# Patient Record
Sex: Female | Born: 1990 | Race: White | Hispanic: No | Marital: Single | Smoking: Former smoker
Health system: Southern US, Community
[De-identification: ages and names within clinical notes are randomized; demographics above are authoritative.]

## PROBLEM LIST (undated history)

## (undated) DIAGNOSIS — F319 Bipolar disorder, unspecified: Secondary | ICD-10-CM

## (undated) DIAGNOSIS — O99210 Obesity complicating pregnancy, unspecified trimester: Secondary | ICD-10-CM

## (undated) DIAGNOSIS — O99340 Other mental disorders complicating pregnancy, unspecified trimester: Secondary | ICD-10-CM

## (undated) DIAGNOSIS — K429 Umbilical hernia without obstruction or gangrene: Secondary | ICD-10-CM

## (undated) DIAGNOSIS — E079 Disorder of thyroid, unspecified: Secondary | ICD-10-CM

## (undated) DIAGNOSIS — E669 Obesity, unspecified: Secondary | ICD-10-CM

## (undated) DIAGNOSIS — F419 Anxiety disorder, unspecified: Secondary | ICD-10-CM

## (undated) HISTORY — PX: HERNIA REPAIR: SHX51

## (undated) HISTORY — PX: BACK SURGERY: SHX140

## (undated) HISTORY — DX: Anxiety disorder, unspecified: F41.9

## (undated) HISTORY — DX: Other mental disorders complicating pregnancy, unspecified trimester: O99.340

## (undated) HISTORY — PX: ABDOMINAL SURGERY: SHX537

## (undated) HISTORY — DX: Umbilical hernia without obstruction or gangrene: K42.9

## (undated) HISTORY — DX: Obesity complicating pregnancy, unspecified trimester: O99.210

## (undated) HISTORY — DX: Disorder of thyroid, unspecified: E07.9

## (undated) HISTORY — DX: Obesity complicating pregnancy, unspecified trimester: E66.9

---
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
## 2011-03-04 ENCOUNTER — Emergency Department: Payer: Self-pay | Admitting: *Deleted

## 2011-03-04 LAB — URINALYSIS, COMPLETE
Nitrite: NEGATIVE
Protein: NEGATIVE
Specific Gravity: 1.023 (ref 1.003–1.030)
WBC UR: 9 /HPF (ref 0–5)

## 2011-03-04 LAB — PREGNANCY, URINE: Pregnancy Test, Urine: NEGATIVE m[IU]/mL

## 2011-03-30 ENCOUNTER — Ambulatory Visit: Payer: Self-pay | Admitting: Internal Medicine

## 2011-04-20 ENCOUNTER — Encounter: Payer: Self-pay | Admitting: Internal Medicine

## 2011-04-23 ENCOUNTER — Encounter: Payer: Self-pay | Admitting: Internal Medicine

## 2011-05-21 ENCOUNTER — Emergency Department: Payer: Self-pay | Admitting: Emergency Medicine

## 2011-07-27 ENCOUNTER — Ambulatory Visit (INDEPENDENT_AMBULATORY_CARE_PROVIDER_SITE_OTHER): Payer: Medicaid Other | Admitting: Family Medicine

## 2011-07-27 ENCOUNTER — Encounter: Payer: Self-pay | Admitting: Family Medicine

## 2011-07-27 VITALS — BP 105/73 | HR 81 | Ht 68.0 in | Wt 340.0 lb

## 2011-07-27 DIAGNOSIS — E669 Obesity, unspecified: Secondary | ICD-10-CM | POA: Insufficient documentation

## 2011-07-27 DIAGNOSIS — N946 Dysmenorrhea, unspecified: Secondary | ICD-10-CM

## 2011-07-27 DIAGNOSIS — Z3040 Encounter for surveillance of contraceptives, unspecified: Secondary | ICD-10-CM

## 2011-07-27 MED ORDER — DICLOFENAC SODIUM 75 MG PO TBEC: 75.0000 mg | DELAYED_RELEASE_TABLET | Freq: Two times a day (BID) | ORAL | Status: AC

## 2011-07-27 NOTE — Patient Instructions (Signed)
Contraceptive Implant Information A contraceptive implant is a plastic rod that is inserted under the skin. It is usually inserted under the skin of your upper arm. It continually releases small amounts of progestin (synthetic progesterone) into the bloodstream. This prevents an egg from being released from the ovary. It also thickens the cervical mucus to prevent sperm from entering the cervix, and it thins the uterine lining to prevent a fertilized egg from attaching to the uterus. They can be effective for up to 3 years. Implants do not provide protection against sexually transmitted diseases (STDs).  The procedure to insert an implant usually takes about 10 minutes. There may be minor bruising, swelling, and discomfort at the insertion site for a couple days. The implant begins to work within the first day. Other contraceptive protection should be used for 2 weeks. Follow up with your caregiver to get rechecked as directed. Your caregiver will make sure you are a good candidate for the contraceptive implant. Discuss with your caregiver the possible side effects of the implant ADVANTAGES  It prevents pregnancy for up to 3 years.   It is easily reversible.   It is convenient.   The progestins may protect against uterine and ovarian cancer.   It can be used when breastfeeding.   It can be used by women who cannot take estrogen.  DISADVANTAGES  You may have irregular or unplanned vaginal bleeding.   You may develop side effects, including headache, weight gain, acne, breast tenderness, or mood changes.   You may have tissue or nerve damage after insertion (rare).   It may be difficult and uncomfortable to remove.   Certain medications may interfere with the effectiveness of the implants.  REMOVAL OF IMPLANT The implant should be removed in 3 years or as directed by your caregiver. The implants effect wears off in a few hours after removal. Your ability to get pregnant (fertility) is  restored within a couple of weeks. New implants can be inserted as soon as the old ones are removed if desired. DO NOT GET THE IMPLANT IF:   You are pregnant.   You have a history of breast cancer, osteoporosis, blood clots, heart disease, diabetes, high blood pressure, liver disease, tumors, or stroke.    You have undiagnosed vaginal bleeding.   You have overly sensitive to certain parts of the implant.  Document Released: 01/28/2011 Document Reviewed: 01/26/2011 [facility] Patient Information 2012 [place], [place].Dysmenorrhea Menstrual pain is caused by the muscles of the uterus tightening (contracting) during a menstrual period. The muscles of the uterus contract due to the chemicals in the uterine lining. Primary dysmenorrhea is menstrual cramps that last a couple of days when you start having menstrual periods or soon after. This often begins after a teenager starts having her period. As a woman gets older or has a baby, the cramps will usually lesson or disappear. Secondary dysmenorrhea begins later in life, lasts longer, and the pain may be stronger than primary dysmenorrhea. The pain may start before the period and last a few days after the period. This type of dysmenorrhea is usually caused by an underlying problem such as:  The tissue lining the uterus grows outside of the uterus in other areas of the body (endometriosis).   The endometrial tissue, which normally lines the uterus, is found in or grows into the muscular walls of the uterus (adenomyosis).   The pelvic blood vessels are engorged with blood just before the menstrual period (pelvic congestive syndrome).   Overgrowth  of cells in the lining of the uterus or cervix (polyps of the uterus or cervix).   Falling down of the uterus (prolapse) because of loose or stretched ligaments.   Depression.   Bladder problems, infection, or inflammation.   Problems with the intestine, a tumor, or irritable bowel syndrome.   Cancer  of the female organs or bladder.   A severely tipped uterus.   A very tight opening or closed cervix.   Noncancerous tumors of the uterus (fibroids).   Pelvic inflammatory disease (PID).   Pelvic scarring (adhesions) from a previous surgery.   Ovarian cyst.   An intrauterine device (IUD) used for birth control.  CAUSES  The cause of menstrual pain is often unknown. SYMPTOMS   Cramping or throbbing pain in your lower abdomen.   Sometimes, a woman may also experience headaches.   Lower back pain.   Feeling sick to your stomach (nausea) or vomiting.   Diarrhea.   Sweating or dizziness.  DIAGNOSIS  A diagnosis is based on your history, symptoms, physical examination, diagnostic tests, or procedures. Diagnostic tests or procedures may include:  Blood tests.   An ultrasound.   An examination of the lining of the uterus (dilation and curettage, D&C).   An examination inside your abdomen or pelvis with a scope (laparoscopy).   X-rays.   CT Scan.   MRI.   An examination inside the bladder with a scope (cystoscopy).   An examination inside the intestine or stomach with a scope (colonoscopy, gastroscopy).  TREATMENT  Treatment depends on the cause of the dysmenorrhea. Treatment may include:  Pain medicine prescribed by your caregiver.   Birth control pills.   Hormone replacement therapy.   Nonsteroidal anti-inflammatory drugs (NSAIDs). These may help stop the production of prostaglandins.   An IUD with progesterone hormone in it.   Acupuncture.   Surgery to remove adhesions, endometriosis, ovarian cyst, or fibroids.   Removal of the uterus (hysterectomy).   Progesterone shots to stop the menstrual period.   Cutting the nerves on the sacrum that go to the female organs (presacral neurectomy).   Electric currant to the sacral nerves (sacral nerve stimulation).   Antidepressant medicine.   Psychiatric therapy, counseling, or group therapy.   Exercise  and physical therapy.   Meditation and yoga therapy.  HOME CARE INSTRUCTIONS   Only take over-the-counter or prescription medicines for pain, discomfort, or fever as directed by your caregiver.   Place a heating pad or hot water bottle on your lower back or abdomen. Do not sleep with the heating pad.   Use aerobic exercises, walking, swimming, biking, and other exercises to help lessen the cramping.   Massage to the lower back or abdomen may help.   Stop smoking.   Avoid alcohol and caffeine.   Yoga, meditation, or acupuncture may help.  SEEK MEDICAL CARE IF:   The pain does not get better with medicine.   You have pain with sexual intercourse.  SEEK IMMEDIATE MEDICAL CARE IF:   Your pain increases and is not controlled with medicines.   You have a fever.   You develop nausea or vomiting with your period not controlled with medicine.   You have abnormal vaginal bleeding with your period.   You pass out.  MAKE SURE YOU:   Understand these instructions.   Will watch your condition.   Will get help right away if you are not doing well or get worse.  Document Released: 02/08/2005 Document Revised: 01/28/2011 Document  Reviewed: 05/27/2008 [facility] Patient Information 2012 [place], [place].

## 2011-07-27 NOTE — Progress Notes (Signed)
  Subjective:    Patient ID: Tammy Hickman, female    DOB: 12-18-1990, 21 y.o.   MRN: [id number]  HPI Implanon placed approximately 9 months ago. Has had monthly cycles the somewhat heavy with lots of cramping. Additionally, has a boil that has come up on her arm this thinks might be related to her Implanon.   Review of Systems  Constitutional: Negative for fever.  Gastrointestinal: Positive for abdominal pain (cramping with cycle).  Genitourinary: Positive for vaginal bleeding.       Objective:   Physical Exam  Vitals reviewed. Constitutional: She is oriented to person, place, and time. She appears well-developed and well-nourished.  HENT:  Head: Normocephalic and atraumatic.  Neck: Neck supple.  Cardiovascular: Normal rate.   Abdominal: Soft.  Neurological: She is alert and oriented to person, place, and time.  Skin:       Healing furuncle noted.  Implanon palpated not near furuncle.          Assessment & Plan:   1. Dysmenorrhea  diclofenac (VOLTAREN) 75 MG EC tablet  2. Obesity    3. Contraception, generic surveillance    Continue Implanon

## 2011-10-07 ENCOUNTER — Emergency Department: Payer: Self-pay | Admitting: Emergency Medicine

## 2011-10-20 ENCOUNTER — Emergency Department: Payer: Self-pay | Admitting: Emergency Medicine

## 2012-03-23 ENCOUNTER — Emergency Department: Payer: Self-pay | Admitting: Emergency Medicine

## 2012-07-02 IMAGING — US ABDOMEN ULTRASOUND
1 series · 17 of 25 positions shown · non-contrast
Comparison: none

REASON FOR EXAM: periumbilical pain on left prior right hernia
COMMENTS:

[Series 1: abdomen ultrasound · 17 of 66 slices shown]
[im 1/66]
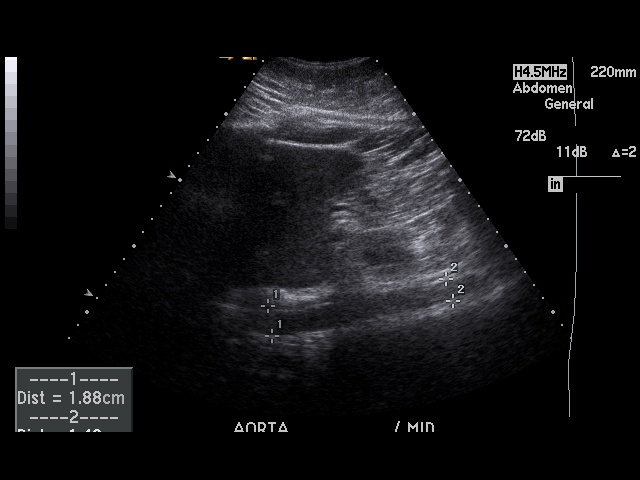
[im 6/66]
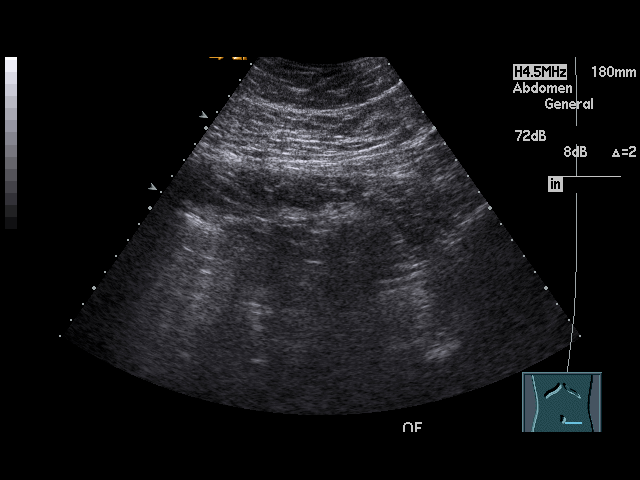
[im 9/66]
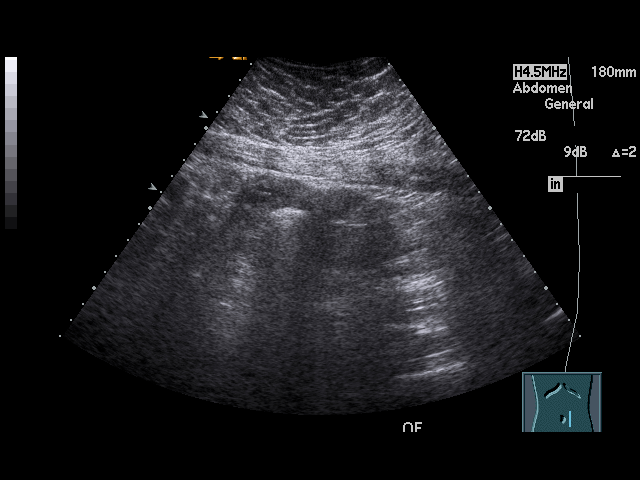
[im 14/66]
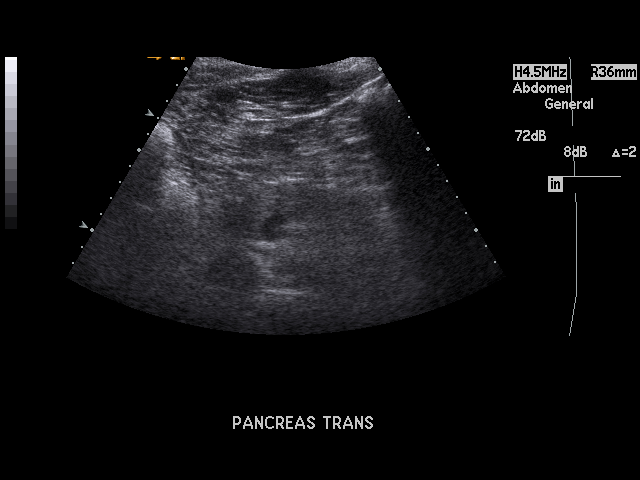
[im 17/66]
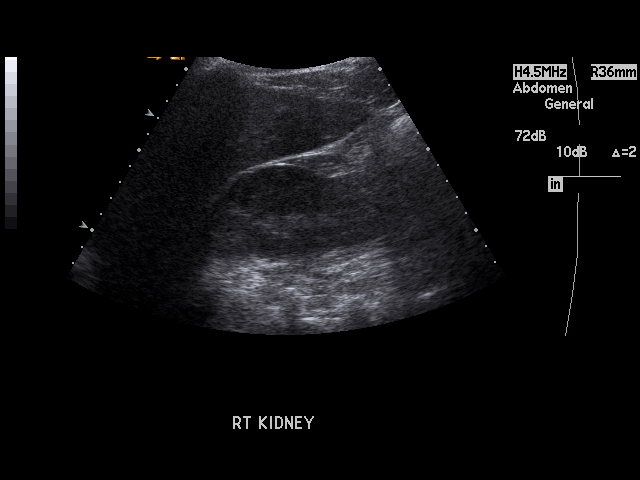
[im 22/66]
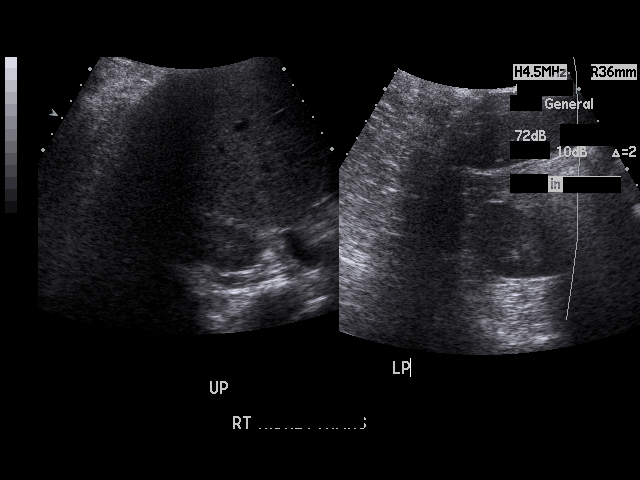
[im 25/66]
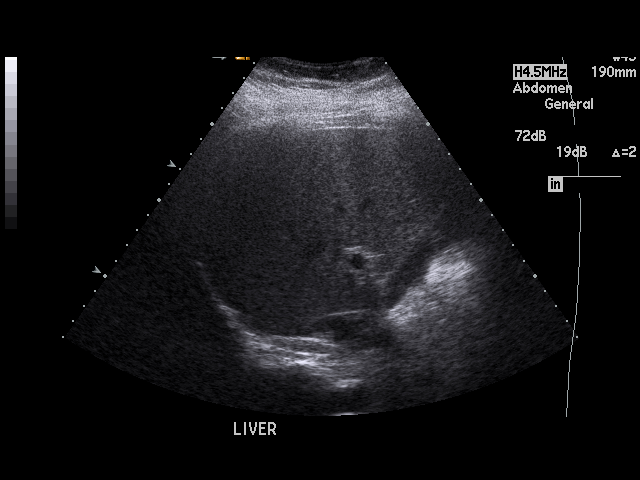
[im 30/66]
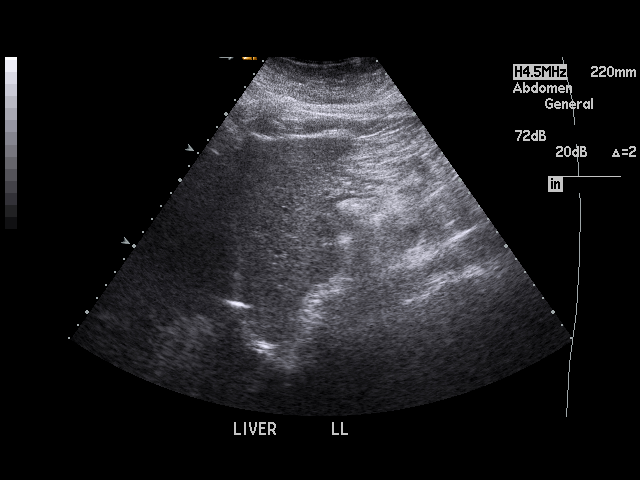
[im 33/66]
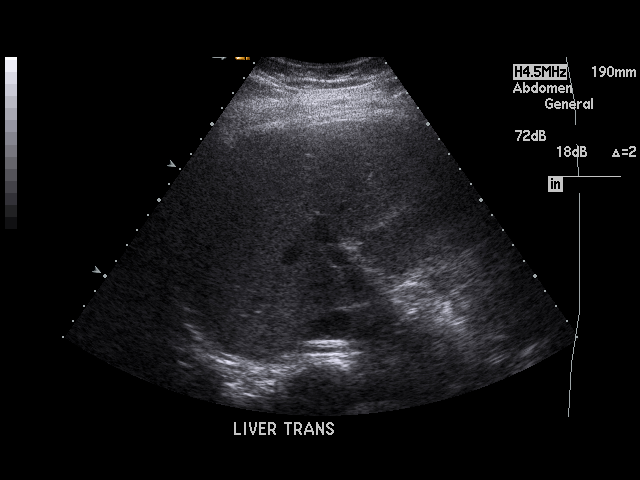
[im 36/66]
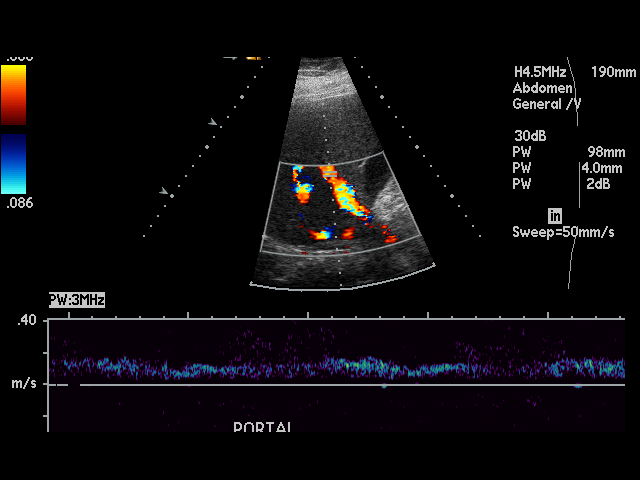
[im 41/66]
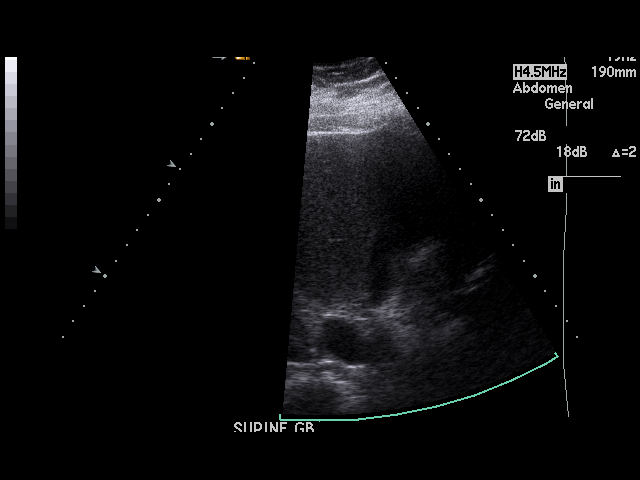
[im 44/66]
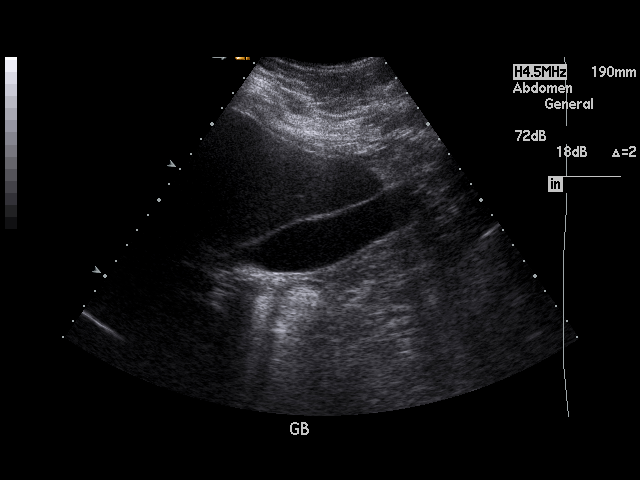
[im 49/66]
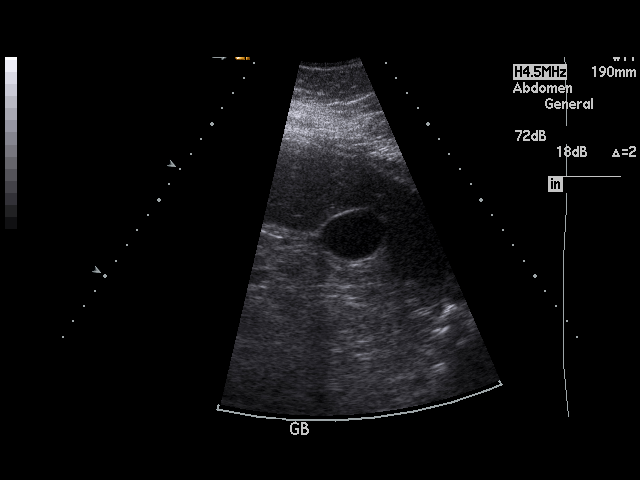
[im 52/66]
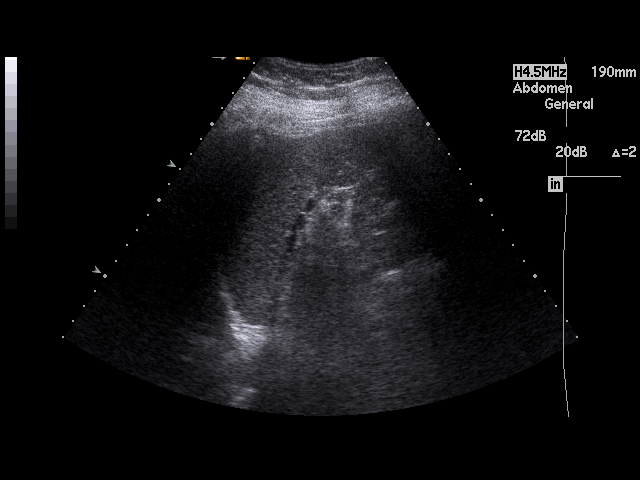
[im 57/66]
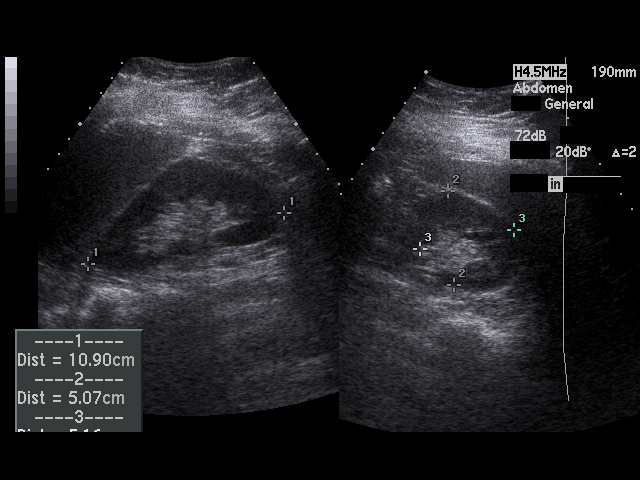
[im 60/66]
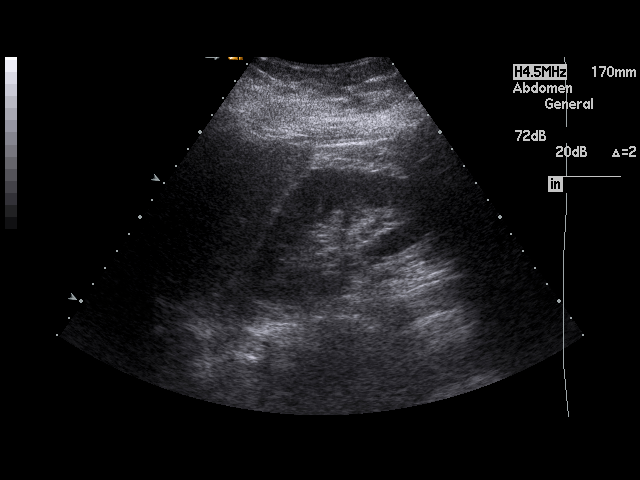
[im 66/66]
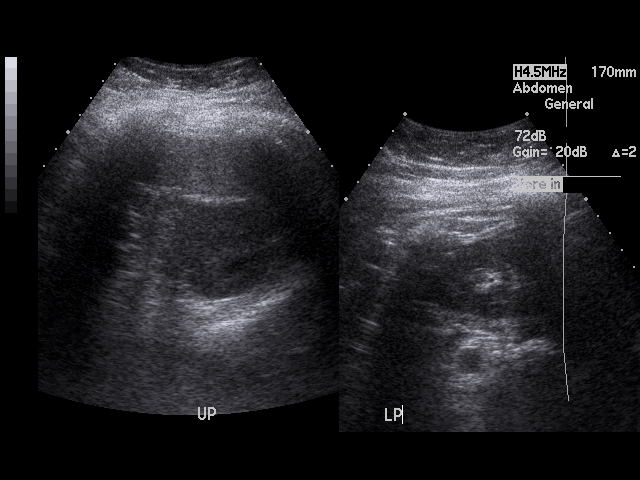

[17 of 25 positions shown; findings below may reference images not displayed]

PROCEDURE:     US  - US ABDOMEN GENERAL SURVEY  - December 29, 2010  [DATE]

RESULT:     Ultrasound of the abdomen demonstrates no evidence of an
umbilical hernia. CT may be more sensitive for a fat filled hernia.
Otherwise, the abdomen appears to be unremarkable. Portions of the pancreas
are obscured by overlying bowel gas. The aorta is normal in caliber. The
common bile duct diameter is 2.5 mm. The portal venous flow is normal. The
inferior vena cava, spleen, liver and kidneys appear normal. No gallstones
are present. The gallbladder wall thickness is 2.2 mm. There is no ascites
or pericholecystic fluid. No sonographic Murphy's sign is present. Right
kidney cortical thickness is 1.34 cm. Left renal cortical thickness is
cm. Right kidney length is 11.81 cm while left kidney length is 10.90 cm.
IMPRESSION: 1.     Limited visualization of portions of the pancreas. Otherwise,
unremarkable abdominal sonogram. CT evaluation for hernia would be more
sensitive than ultrasound especially for a fat filled hernia.

## 2012-08-11 ENCOUNTER — Emergency Department: Payer: Self-pay | Admitting: Emergency Medicine

## 2012-09-14 ENCOUNTER — Emergency Department: Payer: Self-pay | Admitting: Emergency Medicine

## 2012-09-14 LAB — CBC
HCT: 39.4 % (ref 35.0–47.0)
MCH: 26.5 pg (ref 26.0–34.0)
MCHC: 32.8 g/dL (ref 32.0–36.0)
MCV: 81 fL (ref 80–100)
RBC: 4.87 10*6/uL (ref 3.80–5.20)
WBC: 17.9 10*3/uL — ABNORMAL HIGH (ref 3.6–11.0)

## 2012-09-14 LAB — BASIC METABOLIC PANEL
BUN: 13 mg/dL (ref 7–18)
Calcium, Total: 9.1 mg/dL (ref 8.5–10.1)
Chloride: 102 mmol/L (ref 98–107)
Osmolality: 271 (ref 275–301)
Sodium: 136 mmol/L (ref 136–145)

## 2012-09-14 LAB — URINALYSIS, COMPLETE
Bilirubin,UR: NEGATIVE
Glucose,UR: NEGATIVE mg/dL (ref 0–75)
Ketone: NEGATIVE
Ph: 7 (ref 4.5–8.0)
RBC,UR: 5 /HPF (ref 0–5)

## 2012-10-01 ENCOUNTER — Emergency Department: Payer: Self-pay | Admitting: Emergency Medicine

## 2012-10-01 LAB — CBC
HCT: 40.5 % (ref 35.0–47.0)
HGB: 13.5 g/dL (ref 12.0–16.0)
MCH: 27.1 pg (ref 26.0–34.0)
MCHC: 33.4 g/dL (ref 32.0–36.0)
MCV: 81 fL (ref 80–100)
RBC: 4.99 10*6/uL (ref 3.80–5.20)

## 2012-10-01 LAB — URINALYSIS, COMPLETE
Glucose,UR: NEGATIVE mg/dL (ref 0–75)
Ketone: NEGATIVE
Leukocyte Esterase: NEGATIVE
Ph: 6 (ref 4.5–8.0)
Protein: NEGATIVE
RBC,UR: 2 /HPF (ref 0–5)
Specific Gravity: 1.018 (ref 1.003–1.030)
Squamous Epithelial: 3
WBC UR: 1 /HPF (ref 0–5)

## 2012-10-01 LAB — COMPREHENSIVE METABOLIC PANEL
Albumin: 3.5 g/dL (ref 3.4–5.0)
Alkaline Phosphatase: 83 U/L (ref 50–136)
Bilirubin,Total: 0.3 mg/dL (ref 0.2–1.0)
Calcium, Total: 8.8 mg/dL (ref 8.5–10.1)
Chloride: 107 mmol/L (ref 98–107)
Co2: 27 mmol/L (ref 21–32)
Creatinine: 0.7 mg/dL (ref 0.60–1.30)
EGFR (African American): >60
Glucose: 91 mg/dL (ref 65–99)
Potassium: 4.1 mmol/L (ref 3.5–5.1)
SGOT(AST): 16 U/L (ref 15–37)
SGPT (ALT): 25 U/L (ref 12–78)
Sodium: 138 mmol/L (ref 136–145)

## 2012-10-01 LAB — PREGNANCY, URINE: Pregnancy Test, Urine: NEGATIVE m[IU]/mL

## 2012-10-01 LAB — LIPASE, BLOOD: Lipase: 116 U/L (ref 73–393)

## 2012-12-19 ENCOUNTER — Emergency Department: Payer: Self-pay | Admitting: Emergency Medicine

## 2012-12-19 LAB — BASIC METABOLIC PANEL
Anion Gap: 7 (ref 7–16)
BUN: 12 mg/dL (ref 7–18)
Calcium, Total: 9.1 mg/dL (ref 8.5–10.1)
Chloride: 107 mmol/L (ref 98–107)
Co2: 25 mmol/L (ref 21–32)
Creatinine: 0.78 mg/dL (ref 0.60–1.30)
EGFR (African American): >60
EGFR (Non-African Amer.): >60
Glucose: 74 mg/dL (ref 65–99)
Osmolality: 276 (ref 275–301)
Potassium: 3.8 mmol/L (ref 3.5–5.1)
Sodium: 139 mmol/L (ref 136–145)

## 2012-12-19 LAB — URINALYSIS, COMPLETE
Glucose,UR: NEGATIVE mg/dL (ref 0–75)
Ketone: NEGATIVE
Nitrite: NEGATIVE
Ph: 5 (ref 4.5–8.0)
Protein: NEGATIVE
RBC,UR: 6 /HPF (ref 0–5)
Specific Gravity: 1.02 (ref 1.003–1.030)
WBC UR: 9 /HPF (ref 0–5)

## 2012-12-19 LAB — CBC
HCT: 41.3 % (ref 35.0–47.0)
HGB: 13.4 g/dL (ref 12.0–16.0)
MCHC: 32.5 g/dL (ref 32.0–36.0)
Platelet: 343 10*3/uL (ref 150–440)
RDW: 15.1 % — ABNORMAL HIGH (ref 11.5–14.5)
WBC: 13.7 10*3/uL — ABNORMAL HIGH (ref 3.6–11.0)

## 2013-01-20 ENCOUNTER — Emergency Department: Payer: Self-pay | Admitting: Emergency Medicine

## 2013-02-02 ENCOUNTER — Emergency Department: Payer: Self-pay | Admitting: Emergency Medicine

## 2013-02-17 ENCOUNTER — Emergency Department (HOSPITAL_COMMUNITY)
Admission: EM | Admit: 2013-02-17 | Discharge: 2013-02-17 | Discharge status: Against Medical Advice | Payer: Medicaid Other | Attending: Emergency Medicine | Admitting: Emergency Medicine

## 2013-02-17 ENCOUNTER — Emergency Department: Payer: Self-pay | Admitting: Emergency Medicine

## 2013-02-17 ENCOUNTER — Encounter (HOSPITAL_COMMUNITY): Payer: Self-pay | Admitting: Emergency Medicine

## 2013-02-17 DIAGNOSIS — N949 Unspecified condition associated with female genital organs and menstrual cycle: Secondary | ICD-10-CM | POA: Insufficient documentation

## 2013-02-17 DIAGNOSIS — N938 Other specified abnormal uterine and vaginal bleeding: Secondary | ICD-10-CM | POA: Insufficient documentation

## 2013-02-17 LAB — CBC WITH DIFFERENTIAL/PLATELET
Basophils Relative: 0 % (ref 0–1)
Eosinophils Absolute: 0.2 10*3/uL (ref 0.0–0.7)
Eosinophils Relative: 2 % (ref 0–5)
HCT: 43.5 % (ref 36.0–46.0)
Lymphs Abs: 3.3 10*3/uL (ref 0.7–4.0)
MCH: 27.1 pg (ref 26.0–34.0)
MCHC: 32.2 g/dL (ref 30.0–36.0)
MCV: 84.1 fL (ref 78.0–100.0)
Monocytes Relative: 5 % (ref 3–12)
Platelets: 388 10*3/uL (ref 150–400)
RBC: 5.17 MIL/uL — ABNORMAL HIGH (ref 3.87–5.11)

## 2013-02-17 LAB — URINALYSIS, COMPLETE
Bacteria: NONE SEEN
Bilirubin,UR: NEGATIVE
Glucose,UR: NEGATIVE mg/dL (ref 0–75)
Ketone: NEGATIVE
Ph: 7 (ref 4.5–8.0)
RBC,UR: 13 /HPF (ref 0–5)
Specific Gravity: 1.018 (ref 1.003–1.030)
WBC UR: 3 /HPF (ref 0–5)

## 2013-02-17 LAB — URINALYSIS, ROUTINE W REFLEX MICROSCOPIC
Glucose, UA: NEGATIVE mg/dL
Leukocytes, UA: NEGATIVE
Nitrite: NEGATIVE
Specific Gravity, Urine: 1.021 (ref 1.005–1.030)
Urobilinogen, UA: 4 mg/dL — ABNORMAL HIGH (ref 0.0–1.0)

## 2013-02-17 LAB — COMPREHENSIVE METABOLIC PANEL
Albumin: 4.1 g/dL (ref 3.5–5.2)
BUN: 13 mg/dL (ref 6–23)
Calcium: 10 mg/dL (ref 8.4–10.5)
GFR calc Af Amer: >90 mL/min (ref >90)
Glucose, Bld: 109 mg/dL — ABNORMAL HIGH (ref 70–99)
Sodium: 138 mEq/L (ref 135–145)
Total Protein: 8.4 g/dL — ABNORMAL HIGH (ref 6.0–8.3)

## 2013-02-17 LAB — POCT PREGNANCY, URINE: Preg Test, Ur: NEGATIVE

## 2013-02-17 LAB — LIPASE, BLOOD: Lipase: 25 U/L (ref 11–59)

## 2013-02-17 NOTE — ED Notes (Signed)
Pt c/o low abd pain and vaginal bleeding x 1 wk.  States she is passing nickel sized clots.  Pt has implanon and does not know when her periods will come.

## 2013-02-17 NOTE — ED Notes (Signed)
Pt did not answer to name x 2 

## 2013-04-26 ENCOUNTER — Emergency Department: Payer: Self-pay | Admitting: Emergency Medicine

## 2013-04-26 LAB — CBC
HCT: 44.8 % (ref 35.0–47.0)
HGB: 14.3 g/dL (ref 12.0–16.0)
MCH: 26.8 pg (ref 26.0–34.0)
MCHC: 31.9 g/dL — ABNORMAL LOW (ref 32.0–36.0)
MCV: 84 fL (ref 80–100)
Platelet: 328 10*3/uL (ref 150–440)
RBC: 5.33 10*6/uL — ABNORMAL HIGH (ref 3.80–5.20)
RDW: 15 % — ABNORMAL HIGH (ref 11.5–14.5)
WBC: 14.3 10*3/uL — AB (ref 3.6–11.0)

## 2013-04-26 LAB — URINALYSIS, COMPLETE
Bilirubin,UR: NEGATIVE
Blood: NEGATIVE
Glucose,UR: NEGATIVE mg/dL (ref 0–75)
KETONE: NEGATIVE
Leukocyte Esterase: NEGATIVE
NITRITE: NEGATIVE
PROTEIN: NEGATIVE
Ph: 6 (ref 4.5–8.0)
RBC,UR: 2 /HPF (ref 0–5)
Specific Gravity: 1.009 (ref 1.003–1.030)

## 2013-04-26 LAB — COMPREHENSIVE METABOLIC PANEL
ALK PHOS: 83 U/L
ALT: 25 U/L (ref 12–78)
AST: 22 U/L (ref 15–37)
Albumin: 4.5 g/dL (ref 3.4–5.0)
Anion Gap: 5 — ABNORMAL LOW (ref 7–16)
BILIRUBIN TOTAL: 0.4 mg/dL (ref 0.2–1.0)
BUN: 10 mg/dL (ref 7–18)
CREATININE: 0.86 mg/dL (ref 0.60–1.30)
Calcium, Total: 9.1 mg/dL (ref 8.5–10.1)
Chloride: 105 mmol/L (ref 98–107)
Co2: 28 mmol/L (ref 21–32)
EGFR (Non-African Amer.): >60
GLUCOSE: 85 mg/dL (ref 65–99)
Osmolality: 274 (ref 275–301)
Potassium: 3.5 mmol/L (ref 3.5–5.1)
Sodium: 138 mmol/L (ref 136–145)
Total Protein: 9.3 g/dL — ABNORMAL HIGH (ref 6.4–8.2)

## 2013-05-17 NOTE — Telephone Encounter (Signed)
error 

## 2013-07-12 ENCOUNTER — Emergency Department: Payer: Self-pay | Admitting: Emergency Medicine

## 2013-10-01 ENCOUNTER — Emergency Department: Payer: Self-pay | Admitting: Emergency Medicine

## 2013-10-01 LAB — URINALYSIS, COMPLETE
Bacteria: NONE SEEN
Bilirubin,UR: NEGATIVE
Blood: NEGATIVE
GLUCOSE, UR: NEGATIVE mg/dL (ref 0–75)
KETONE: NEGATIVE
NITRITE: NEGATIVE
Ph: 5 (ref 4.5–8.0)
Protein: NEGATIVE
RBC,UR: 4 /HPF (ref 0–5)
SPECIFIC GRAVITY: 1.029 (ref 1.003–1.030)
WBC UR: 15 /HPF (ref 0–5)

## 2013-10-23 ENCOUNTER — Ambulatory Visit (INDEPENDENT_AMBULATORY_CARE_PROVIDER_SITE_OTHER): Payer: Medicaid Other | Admitting: Obstetrics & Gynecology

## 2013-10-23 ENCOUNTER — Encounter: Payer: Self-pay | Admitting: Obstetrics & Gynecology

## 2013-10-23 VITALS — BP 110/63 | HR 65 | Ht 68.0 in | Wt 309.0 lb

## 2013-10-23 DIAGNOSIS — Z308 Encounter for other contraceptive management: Secondary | ICD-10-CM

## 2013-10-23 DIAGNOSIS — Z3046 Encounter for surveillance of implantable subdermal contraceptive: Secondary | ICD-10-CM

## 2013-10-23 NOTE — Progress Notes (Signed)
   GYNECOLOGY CLINIC PROCEDURE NOTE  Tammy Hickman is a 23 y.o. G1P1001 here for Nexplanon removal; it was placed in 2012. Does not want another birth control method for now, not sexually active currently due to boyfriend's recent diagnosis of paralysis 2/2 epidural abscess.  No other gynecologic concerns.  Nexplanon Removal Patient was given informed consent for removal of her Nexplanon.  Appropriate time out taken. Nexplanon site identified.  Area prepped in usual sterile fashon. One ml of 1% lidocaine was used to anesthetize the area at the distal end of the implant. A small stab incision was made right beside the implant on the distal portion.  The Nexplanon rod was grasped using hemostats and removed without difficulty.  There was minimal blood loss. There were no complications.  3 ml of 1% lidocaine was injected around the incision for post-procedure analgesia.  Steri-strips were applied over the small incision.  A pressure bandage was applied to reduce any bruising.  The patient tolerated the procedure well and was given post procedure instructions.    Appropriate support given to patient given boyfriend's diagnosis.  Routine preventative health maintenance measures emphasized.   Jaynie Collins, MD, FACOG Attending Obstetrician & Gynecologist Center for [employer], [facility]

## 2013-10-23 NOTE — Progress Notes (Signed)
Would like Implanon removed.  It has been three years and she has had irregular bleeding the entire time.  She does not wish to go on birth control at this time.  Her partner was recently paralyzed from an epidural abscess and they are working on his recovery now.

## 2013-10-23 NOTE — Patient Instructions (Signed)
Return to clinic for any scheduled appointments or for any gynecologic concerns as needed.   

## 2013-12-24 ENCOUNTER — Encounter: Payer: Self-pay | Admitting: Obstetrics & Gynecology

## 2013-12-26 ENCOUNTER — Emergency Department: Payer: Self-pay | Admitting: Emergency Medicine

## 2013-12-26 LAB — COMPREHENSIVE METABOLIC PANEL
ALT: 29 U/L
Albumin: 3.5 g/dL (ref 3.4–5.0)
Alkaline Phosphatase: 60 U/L
Anion Gap: 9 (ref 7–16)
BILIRUBIN TOTAL: 0.2 mg/dL (ref 0.2–1.0)
BUN: 15 mg/dL (ref 7–18)
Calcium, Total: 8.3 mg/dL — ABNORMAL LOW (ref 8.5–10.1)
Chloride: 107 mmol/L (ref 98–107)
Co2: 26 mmol/L (ref 21–32)
Creatinine: 0.93 mg/dL (ref 0.60–1.30)
Glucose: 86 mg/dL (ref 65–99)
Osmolality: 283 (ref 275–301)
Potassium: 4.2 mmol/L (ref 3.5–5.1)
SGOT(AST): 20 U/L (ref 15–37)
Sodium: 142 mmol/L (ref 136–145)
Total Protein: 7.3 g/dL (ref 6.4–8.2)

## 2013-12-26 LAB — CBC
HCT: 39.7 % (ref 35.0–47.0)
HGB: 12.6 g/dL (ref 12.0–16.0)
MCH: 27.1 pg (ref 26.0–34.0)
MCHC: 31.8 g/dL — ABNORMAL LOW (ref 32.0–36.0)
MCV: 85 fL (ref 80–100)
Platelet: 297 10*3/uL (ref 150–440)
RBC: 4.66 10*6/uL (ref 3.80–5.20)
RDW: 15.4 % — ABNORMAL HIGH (ref 11.5–14.5)
WBC: 13.4 10*3/uL — ABNORMAL HIGH (ref 3.6–11.0)

## 2013-12-26 LAB — URINALYSIS, COMPLETE
BACTERIA: NONE SEEN
BLOOD: NEGATIVE
Bilirubin,UR: NEGATIVE
Glucose,UR: NEGATIVE mg/dL (ref 0–75)
KETONE: NEGATIVE
Nitrite: NEGATIVE
PH: 7 (ref 4.5–8.0)
Protein: NEGATIVE
RBC,UR: 2 /HPF (ref 0–5)
Specific Gravity: 1.019 (ref 1.003–1.030)
Squamous Epithelial: 7
WBC UR: 4 /HPF (ref 0–5)

## 2014-04-05 IMAGING — CT CT ABD-PELV W/ CM
1 of 2 series · 15 of 32 positions shown, 19 images · non-contrast
Comparison: none

REASON FOR EXAM: (1) Left lower quardant pain, leukocytosis; (2) Left
lower quardant pain, leukoc
COMMENTS:

[Series 2: 3mm soft tissue · axial · 0.89mm/px · z∈[-928,-397]mm · 15 of 193 slices shown, 19 images]
[im 8/193  soft-tissue]
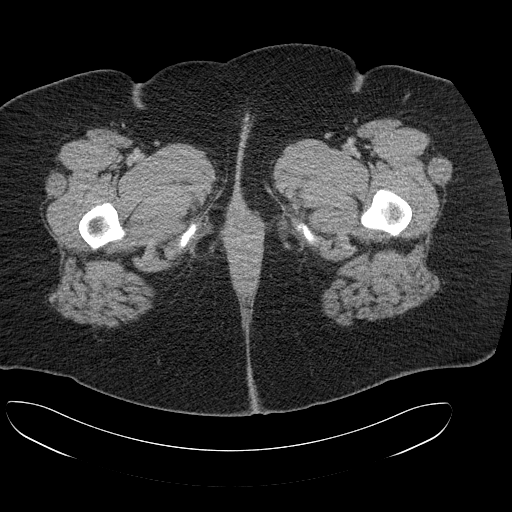
[im 8/193  bone]
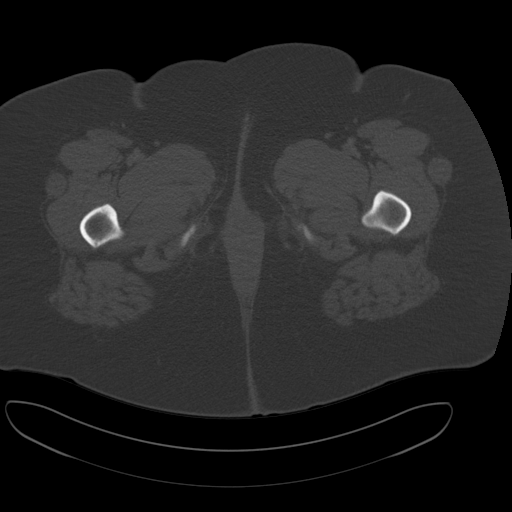
[im 24/193  soft-tissue]
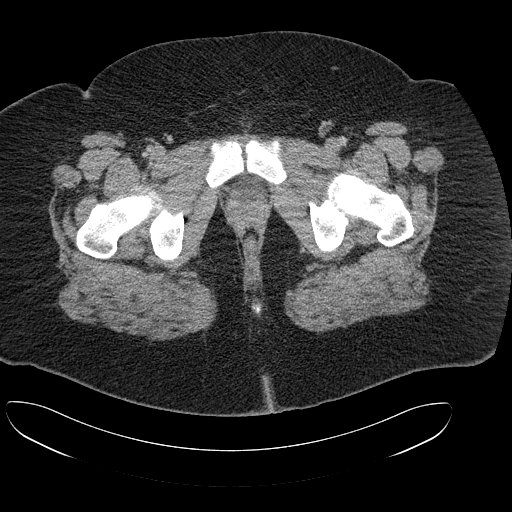
[im 39/193  soft-tissue]
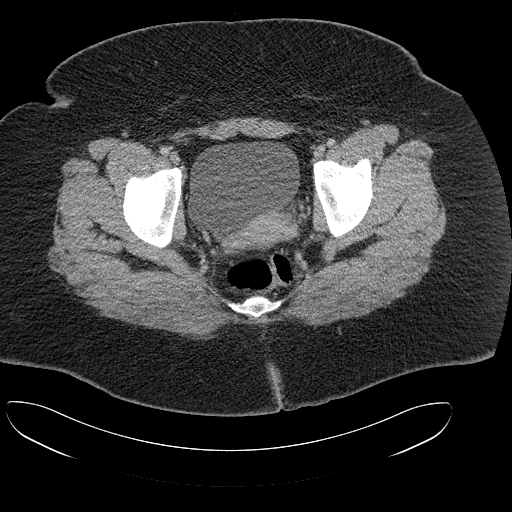
[im 54/193  soft-tissue]
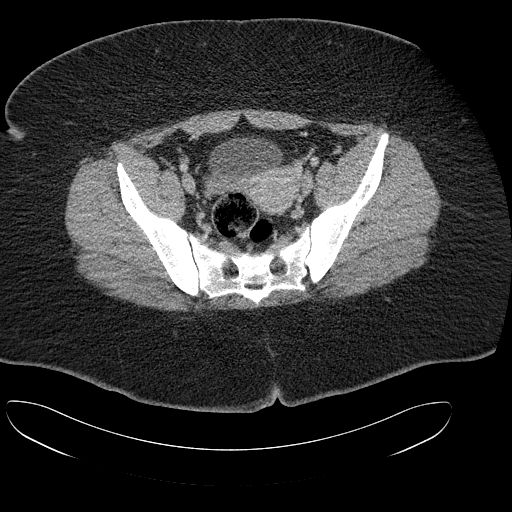
[im 70/193  soft-tissue]
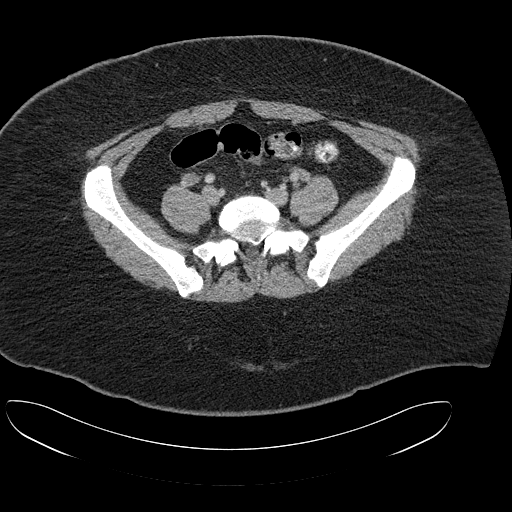
[im 85/193  soft-tissue]
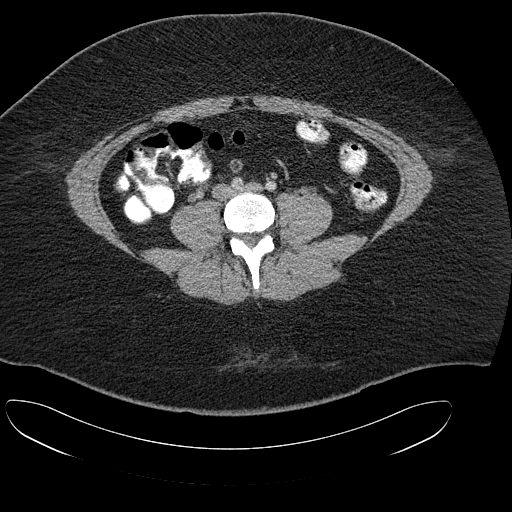
[im 100/193  soft-tissue]
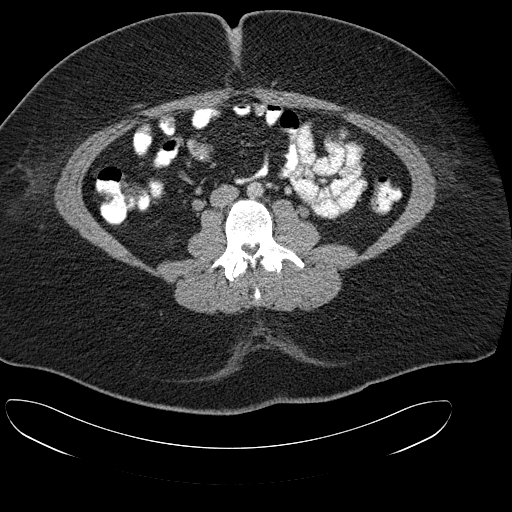
[im 108/193  soft-tissue]
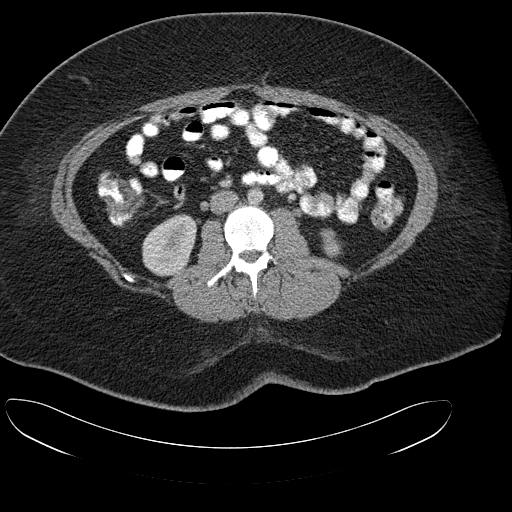
[im 123/193  soft-tissue]
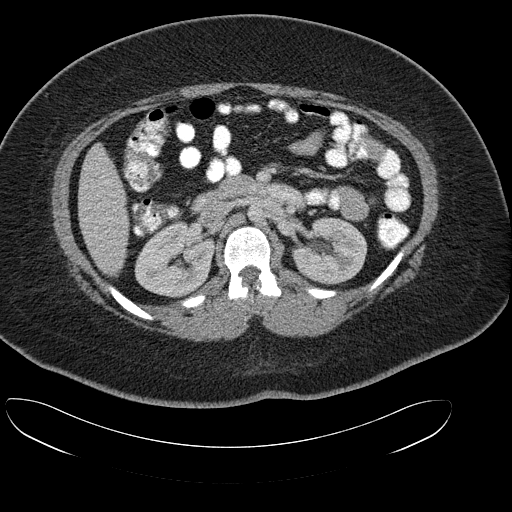
[im 123/193  bone]
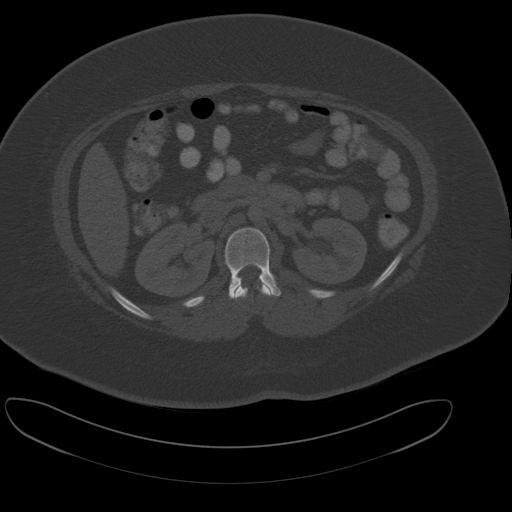
[im 139/193  soft-tissue]
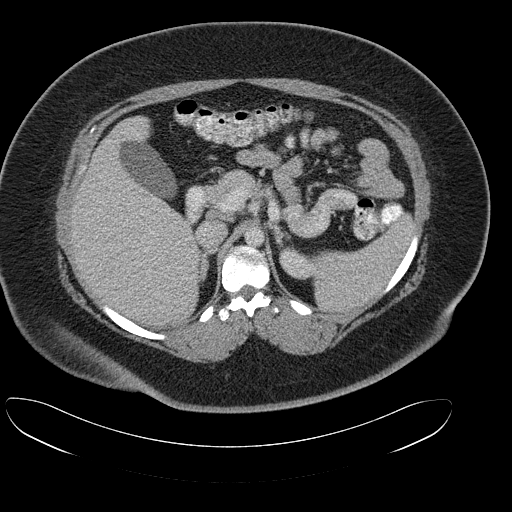
[im 154/193  soft-tissue]
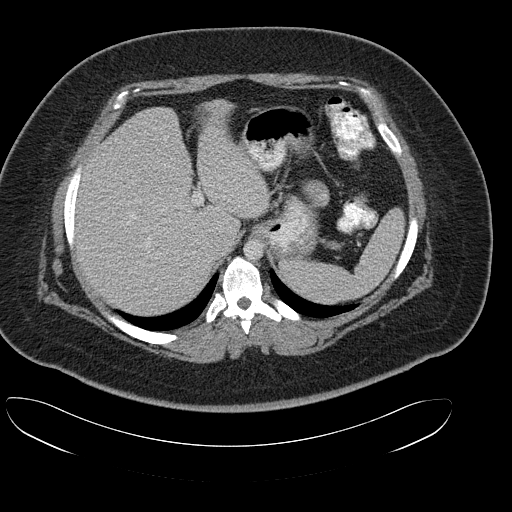
[im 162/193  lung]
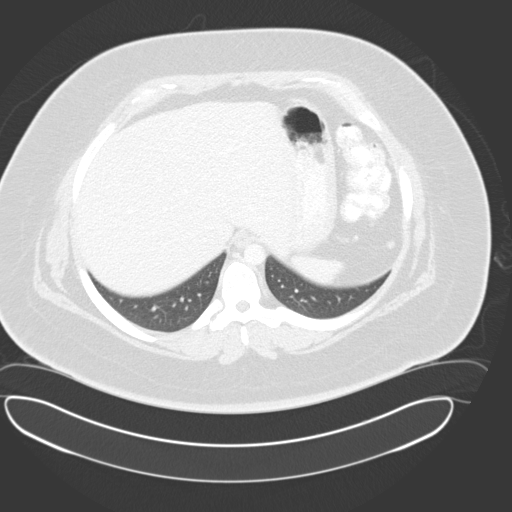
[im 169/193  soft-tissue]
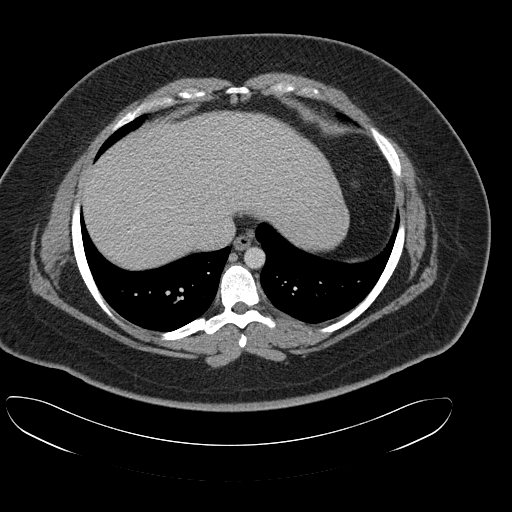
[im 169/193  lung]
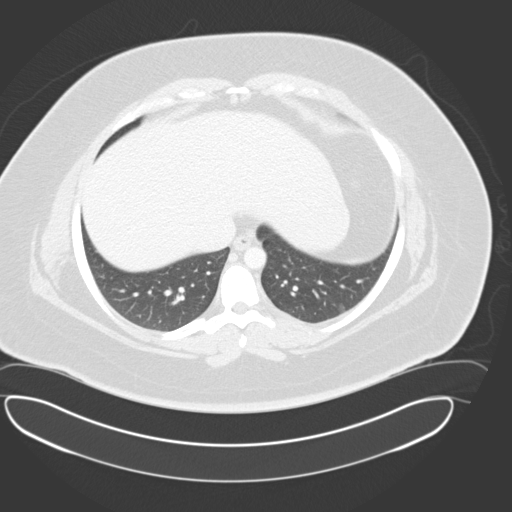
[im 177/193  lung]
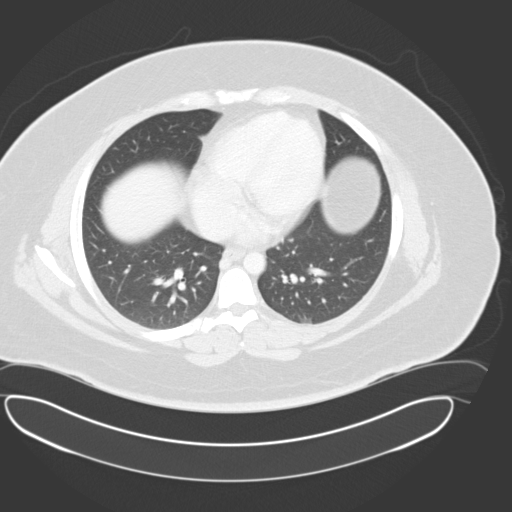
[im 185/193  soft-tissue]
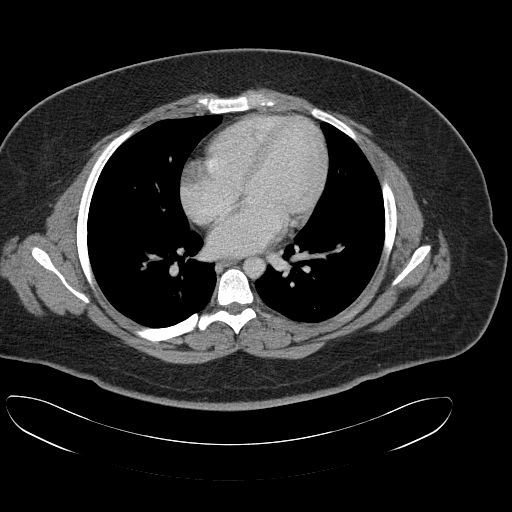
[im 185/193  lung]
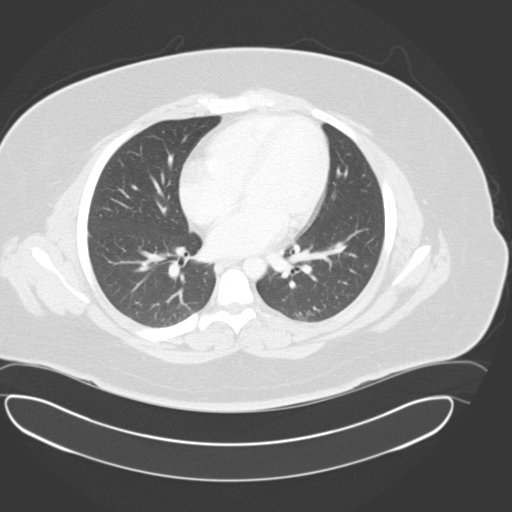

[15 of 32 positions shown; findings below may reference images not displayed]

PROCEDURE:     CT  - CT ABDOMEN / PELVIS  W  - October 01, 2012  [DATE]

RESULT:     Axial CT scanning was performed through the abdomen and pelvis
with reconstructions at 3 mm intervals and slice thicknesses. The patient
received 100 cc of Isovue 370 for this study. The patient also received oral
contrast material.

The liver exhibits no focal mass nor ductal dilation. The gallbladder is
adequately distended with no evidence of stones or surrounding inflammatory
change. The pancreas, spleen, partially distended stomach, adrenal glands,
and kidneys are normal in appearance. The caliber of the abdominal aorta is
normal. The periaortic and pericaval regions exhibit no lymphadenopathy or
inflammatory changes.

The partially contrast-filled loops of small and large bowel exhibit no
evidence of ileus nor of obstruction. A structure consistent with an
uninflamed appendix is demonstrated on images 95 through 120. There is no
inflammatory change within the pericolonic mesenteric fat. The psoas
musculature exhibits normal density with no evidence of an abscess.

Within the pelvis the urinary bladder and uterus and adnexal structures
exhibit no acute abnormalities. There is no free fluid in the pelvis. There
is no inguinal nor pelvic lymphadenopathy. There is a tiny fat-containing
umbilical hernia. There is no inguinal hernia.

The lung bases are clear. The lumbar vertebral bodies are preserved in
height.
IMPRESSION: 1. There is no evidence of acute diverticulitis or other forms of colitis or
acute appendicitis. The appendix measures approximately 10 mm in diameter
and there is no surrounding inflammatory change.
2. There is no evidence of urinary tract obstruction or pyelonephritis.
3. There is no evidence of acute cholecystitis nor other acute hepatobiliary
abnormality.
4. No intra-abdominal or pelvic abscess or fluid collections are
demonstrated.
5. The lung bases exhibit no acute abnormalities.

[REDACTED]

## 2014-05-04 ENCOUNTER — Emergency Department: Payer: Self-pay | Admitting: Emergency Medicine

## 2014-07-18 ENCOUNTER — Emergency Department
Admission: EM | Admit: 2014-07-18 | Discharge: 2014-07-18 | Disposition: A | Discharge status: Home / Self Care | Payer: Medicaid Other | Attending: Emergency Medicine | Admitting: Emergency Medicine

## 2014-07-18 ENCOUNTER — Encounter: Payer: Self-pay | Admitting: Emergency Medicine

## 2014-07-18 DIAGNOSIS — Z79899 Other long term (current) drug therapy: Secondary | ICD-10-CM | POA: Insufficient documentation

## 2014-07-18 DIAGNOSIS — L02511 Cutaneous abscess of right hand: Secondary | ICD-10-CM | POA: Diagnosis not present

## 2014-07-18 DIAGNOSIS — Z720 Tobacco use: Secondary | ICD-10-CM | POA: Insufficient documentation

## 2014-07-18 DIAGNOSIS — L0291 Cutaneous abscess, unspecified: Secondary | ICD-10-CM

## 2014-07-18 DIAGNOSIS — M79641 Pain in right hand: Secondary | ICD-10-CM | POA: Diagnosis present

## 2014-07-18 MED ORDER — LIDOCAINE HCL (PF) 1 % IJ SOLN
5.0000 mL | Freq: Once | INTRAMUSCULAR | Status: AC
  Administered 2014-07-18: 5 mL

## 2014-07-18 MED ORDER — OXYCODONE-ACETAMINOPHEN 5-325 MG PO TABS
ORAL_TABLET | ORAL | Status: AC
  Filled 2014-07-18: qty 1

## 2014-07-18 MED ORDER — OXYCODONE-ACETAMINOPHEN 5-325 MG PO TABS: 1.0000 | ORAL_TABLET | Freq: Three times a day (TID) | ORAL | Status: DC | PRN

## 2014-07-18 MED ORDER — IBUPROFEN 800 MG PO TABS
800.0000 mg | ORAL_TABLET | Freq: Three times a day (TID) | ORAL | Status: DC | PRN
Start: 2014-07-18 — End: 2015-01-13

## 2014-07-18 MED ORDER — SULFAMETHOXAZOLE-TRIMETHOPRIM 400-80 MG PO TABS: 2.0000 | ORAL_TABLET | Freq: Two times a day (BID) | ORAL | Status: DC

## 2014-07-18 MED ORDER — LIDOCAINE HCL (PF) 1 % IJ SOLN
INTRAMUSCULAR | Status: AC
  Filled 2014-07-18: qty 5

## 2014-07-18 MED ORDER — ONDANSETRON 4 MG PO TBDP
ORAL_TABLET | ORAL | Status: AC
  Filled 2014-07-18: qty 1

## 2014-07-18 MED ORDER — OXYCODONE-ACETAMINOPHEN 5-325 MG PO TABS
1.0000 | ORAL_TABLET | Freq: Once | ORAL | Status: AC
  Administered 2014-07-18: 1 via ORAL

## 2014-07-18 MED ORDER — ONDANSETRON 4 MG PO TBDP
4.0000 mg | ORAL_TABLET | Freq: Once | ORAL | Status: AC
  Administered 2014-07-18: 4 mg via ORAL

## 2014-07-18 NOTE — Discharge Instructions (Signed)
Take medication as prescribed. Keep clean. Keep elevated. Apply warm compresses to promote drainage.  Return to the ER in 2 days for wound check.  Return to the ER sooner for increased pain, redness,  new or worsening concerns.  Abscess An abscess is an infected area that contains a collection of pus and debris.It can occur in almost any part of the body. An abscess is also known as a furuncle or boil. CAUSES  An abscess occurs when tissue gets infected. This can occur from blockage of oil or sweat glands, infection of hair follicles, or a minor injury to the skin. As the body tries to fight the infection, pus collects in the area and creates pressure under the skin. This pressure causes pain. People with weakened immune systems have difficulty fighting infections and get certain abscesses more often.  SYMPTOMS Usually an abscess develops on the skin and becomes a painful mass that is red, warm, and tender. If the abscess forms under the skin, you may feel a moveable soft area under the skin. Some abscesses break open (rupture) on their own, but most will continue to get worse without care. The infection can spread deeper into the body and eventually into the bloodstream, causing you to feel ill.  DIAGNOSIS  Your caregiver will take your medical history and perform a physical exam. A sample of fluid may also be taken from the abscess to determine what is causing your infection. TREATMENT  Your caregiver may prescribe antibiotic medicines to fight the infection. However, taking antibiotics alone usually does not cure an abscess. Your caregiver may need to make a small cut (incision) in the abscess to drain the pus. In some cases, gauze is packed into the abscess to reduce pain and to continue draining the area. HOME CARE INSTRUCTIONS   Only take over-the-counter or prescription medicines for pain, discomfort, or fever as directed by your caregiver.  If you were prescribed antibiotics, take them  as directed. Finish them even if you start to feel better.  If gauze is used, follow your caregiver's directions for changing the gauze.  To avoid spreading the infection:  Keep your draining abscess covered with a bandage.  Wash your hands well.  Do not share personal care items, towels, or whirlpools with others.  Avoid skin contact with others.  Keep your skin and clothes clean around the abscess.  Keep all follow-up appointments as directed by your caregiver. SEEK MEDICAL CARE IF:   You have increased pain, swelling, redness, fluid drainage, or bleeding.  You have muscle aches, chills, or a general ill feeling.  You have a fever. MAKE SURE YOU:   Understand these instructions.  Will watch your condition.  Will get help right away if you are not doing well or get worse. Document Released: 11/18/2004 Document Revised: 08/10/2011 Document Reviewed: 04/23/2011 [facility] Patient Information 2015 [place], [place]. This information is not intended to replace advice given to you by your health care provider. Make sure you discuss any questions you have with your health care provider.

## 2014-07-18 NOTE — ED Provider Notes (Signed)
[facility] Emergency Department Provider Note  ____________________________________________  Time seen: Approximately 9:28 AM  I have reviewed the triage vital signs and the nursing notes.   HISTORY  Chief Complaint Abscess   HPI Tammy Hickman is a 24 y.o. female presents to the ER for the complaints of right hand pain with, with red swollen area. Patient reports she started out of having what looks to be a mosquito bite a few days ago and gradual onset and increased. Reports times last 3 days with increased pain and swelling. Denies fever, nausea, vomiting, diarrhea, chest pain, shortness of breath. Denies pain radiation. Patient states pain is in hand. Describes pain as 6 out of 10 and very tender to touch. Denies drainage. Denies previous similar situations.   Past Medical History  Diagnosis Date  . Mental disorders of mother, antepartum   . Obesity complicating pregnancy, childbirth, or the puerperium, antepartum condition or complication(649.13)   . Other specified indication for care or intervention related to labor and delivery, antepartum   . Anxiety   . Asthma   . Thyroid dysfunction   . Hernia, umbilical 2011    Patient Active Problem List   Diagnosis Date Noted  . Obesity 07/27/2011  . Contraception, generic surveillance 07/27/2011    Past Surgical History  Procedure Laterality Date  . Tonsillectomy  2009    Current Outpatient Rx  Name  Route  Sig  Dispense  Refill  . amphetamine-dextroamphetamine (ADDERALL) 30 MG tablet   Oral   Take 30 mg by mouth 2 (two) times daily.          Marland Kitchen. aspirin 325 MG tablet   Oral   Take 975 mg by mouth every 4 (four) hours as needed for mild pain or moderate pain.           Allergies Fentanyl; Hydromorphone; Penicillins; and Prednisone  Family History  Problem Relation Age of Onset  . Cancer Paternal Grandfather     Esophageal  . Heart disease Paternal Grandmother   . Diabetes  Maternal Grandmother   . Heart disease Maternal Grandfather   . Diabetes Maternal Grandfather   . Cancer Maternal Grandfather     Esophageal  . Heart disease Maternal Aunt     Social History History  Substance Use Topics  . Smoking status: Current Some Day Smoker    Last Attempt to Quit: 11/14/2009  . Smokeless tobacco: Never Used  . Alcohol Use: No    Review of Systems Constitutional: No fever/chills Eyes: No visual changes. ENT: No sore throat. Cardiovascular: Denies chest pain. Respiratory: Denies shortness of breath. Gastrointestinal: No abdominal pain.  No nausea, no vomiting.  No diarrhea.  No constipation. Genitourinary: Negative for dysuria. Musculoskeletal: Negative for back pain. Skin: Negative for rash. Tender swollen area to right hand Neurological: Negative for headaches, focal weakness or numbness.  10-point ROS otherwise negative.  ____________________________________________   PHYSICAL EXAM:  VITAL SIGNS: ED Triage Vitals  Enc Vitals Group     BP 07/18/14 0419 142/85 mmHg     Pulse Rate 07/18/14 0419 75     Resp --      Temp 07/18/14 0419 97.6 F (36.4 C)     Temp Source 07/18/14 0419 Oral     SpO2 07/18/14 0419 98 %     Weight 07/18/14 0419 250 lb (113.399 kg)     Height 07/18/14 0419 5\' 8"  (1.727 m)     Head Cir --      Peak  Flow --      Pain Score 07/18/14 0427 8     Pain Loc --      Pain Edu? --      Excl. in GC? --     Constitutional: Alert and oriented. Well appearing and in no acute distress. Eyes: Conjunctivae are normal. PERRL. EOMI. Head: Atraumatic. Nose: No congestion/rhinnorhea. Mouth/Throat: Mucous membranes are moist.  Oropharynx non-erythematous. Neck: No stridor.  No cervical spine tenderness to palpation. Hematological/Lymphatic/Immunilogical: No cervical lymphadenopathy. Cardiovascular: Normal rate, regular rhythm. Grossly normal heart sounds.  Good peripheral circulation. Respiratory: Normal respiratory effort.  No  retractions. Lungs CTAB. Gastrointestinal: Soft and nontender. No distention. No abdominal bruits. No CVA tenderness. Musculoskeletal: No lower extremity tenderness nor edema.  No joint effusions. Right dorsal lateral hand with 2 x 2 centimeter fluctuant pointing abscess with mildly erythemic base. No surrounding erythema or swelling. Full range of motion. Sensation and motor intact. Distal radial pulses equal bilaterally. Neurologic:  Normal speech and language. No gross focal neurologic deficits are appreciated. Speech is normal. No gait instability. Skin:  Skin is warm, dry and intact. No rash noted. Psychiatric: Mood and affect are normal. Speech and behavior are normal.   ____________________________________________   PROCEDURES  Procedure(s) performed:  INCISION AND DRAINAGE Performed by: Renford Dills Consent: Verbal consent obtained. Risks and benefits: risks, benefits and alternatives were discussed Type: abscess  Body area: right hand  Anesthesia: local infiltration  Incision was made with a scalpel.  Local anesthetic: lidocaine 1%  Anesthetic total: *3ml  Complexity: complex Blunt dissection to break up loculations  Drainage: purulent  Drainage amount: large  Packing material: 1/4 in iodoform gauze  Patient tolerance: Patient tolerated the procedure well with no immediate complications.   ____________________________________________   INITIAL IMPRESSION / ASSESSMENT AND PLAN / ED COURSE  Pertinent labs & imaging results that were available during my care of the patient were reviewed by me and considered in my medical decision making (see chart for details).  Patient with superficial pointing fluctuant abscess to right dorsal hand. Reports initially started as a small bump and then progress. No surrounding erythema. I&D performed in ER. Patient tolerated well. Large purulent return. Patient to return to the ER in 2 days for wound check. Will treat with  Bactrim orally and as needed pain medication. Discussed keeping elevated and clean. ____________________________________________   FINAL CLINICAL IMPRESSION(S) / ED DIAGNOSES  Final diagnoses:  Abscess  right hand    Renford Dills, NP 07/18/14 8119  Richardean Canal, MD 07/20/14 [phone]

## 2014-07-18 NOTE — ED Notes (Signed)
Patient with vomiting after administration of Percocet, NP aware, verbal order received for Zofran.

## 2014-07-18 NOTE — ED Notes (Signed)
Patient ambulatory to triage with steady gait, without difficulty or distress noted; pt reports wk ago, ?bit by something while outside; redness/swelling noted top of right hand

## 2014-08-07 IMAGING — CR DG THORACIC SPINE 2-3V
1 series · 2 of 2 positions shown · non-contrast
Comparison: None.

CLINICAL DATA: Pain status post fall.

EXAM:
THORACIC SPINE - 2 VIEW

[Series 2: t thoracic spine ap · 0.14mm/px · 2 of 2 slices shown]
[im 1/2]
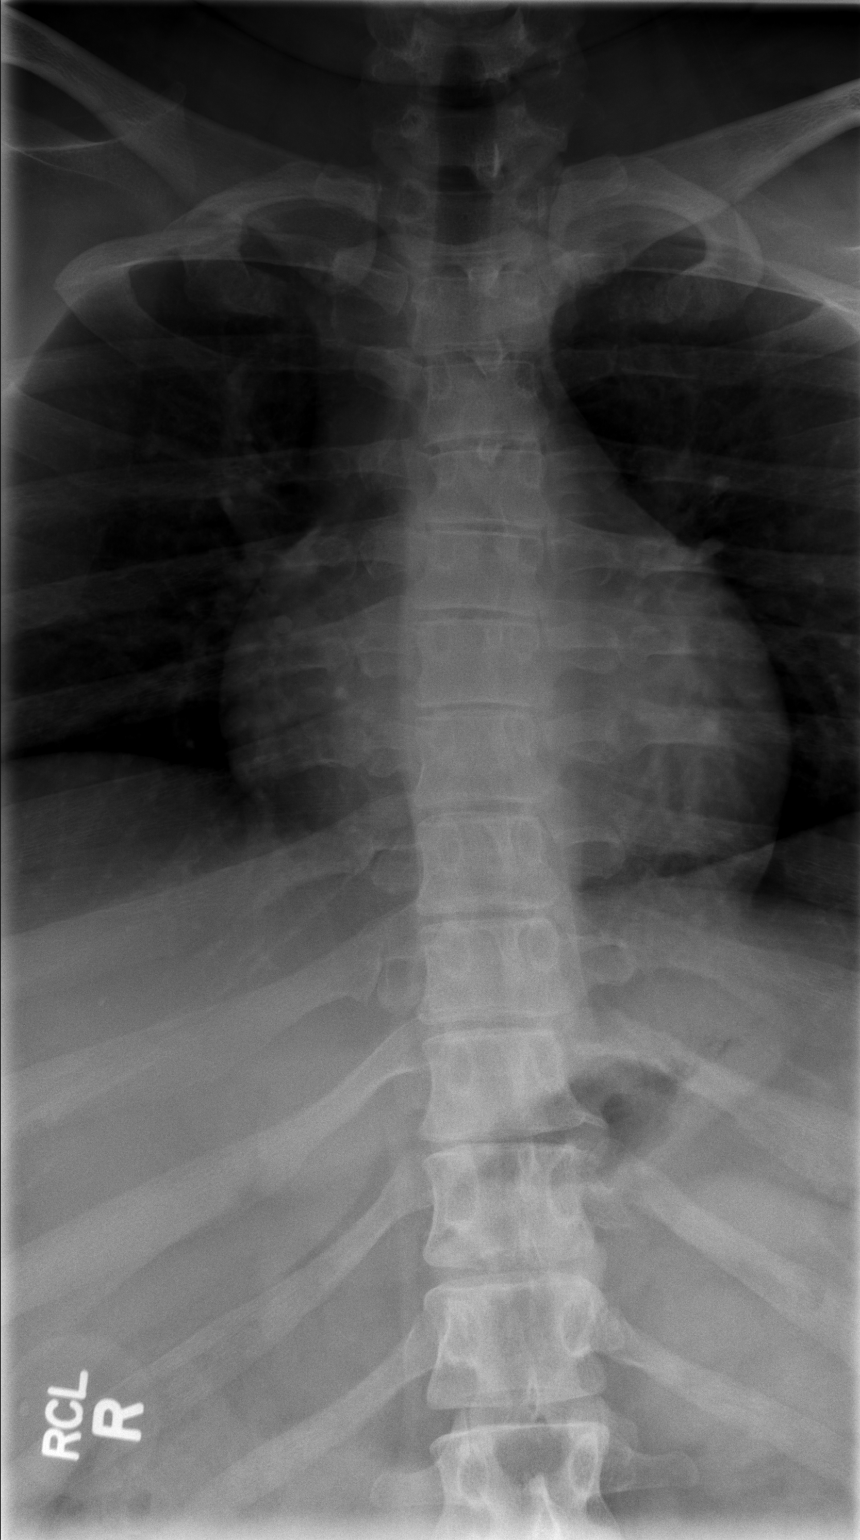
[im 2/2]
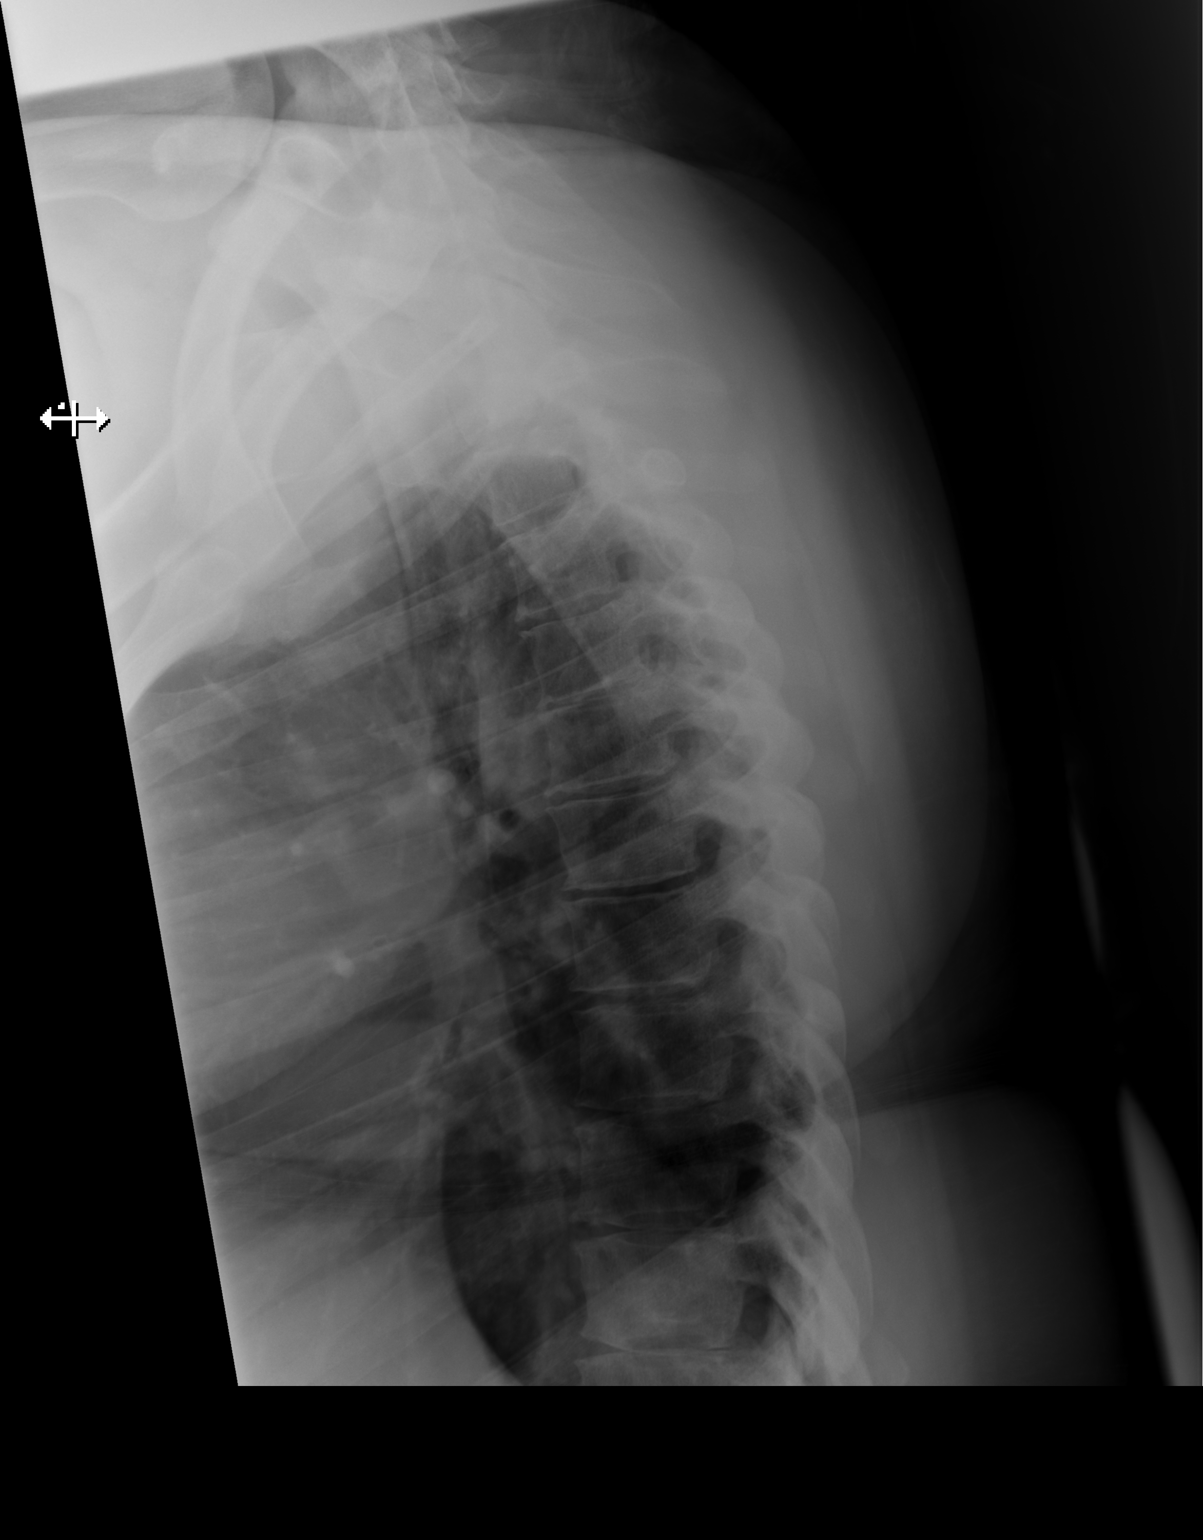

[2 of 2 positions shown; findings below may reference images not displayed]

FINDINGS: The thoracic vertebral bodies are preserved in height. The
intervertebral disc space heights are reasonably well maintained.
The pedicles appear intact. There are no abnormal paravertebral soft
tissue densities.
IMPRESSION: There is no acute bony abnormality of the thoracic spine.

## 2014-08-09 ENCOUNTER — Emergency Department
Admission: EM | Admit: 2014-08-09 | Discharge: 2014-08-09 | Disposition: A | Discharge status: Home / Self Care | Payer: Medicaid Other | Attending: Emergency Medicine | Admitting: Emergency Medicine

## 2014-08-09 DIAGNOSIS — N390 Urinary tract infection, site not specified: Secondary | ICD-10-CM | POA: Insufficient documentation

## 2014-08-09 DIAGNOSIS — Z880 Allergy status to penicillin: Secondary | ICD-10-CM | POA: Diagnosis not present

## 2014-08-09 DIAGNOSIS — M5442 Lumbago with sciatica, left side: Secondary | ICD-10-CM | POA: Insufficient documentation

## 2014-08-09 DIAGNOSIS — R109 Unspecified abdominal pain: Secondary | ICD-10-CM | POA: Diagnosis present

## 2014-08-09 DIAGNOSIS — Z79899 Other long term (current) drug therapy: Secondary | ICD-10-CM | POA: Insufficient documentation

## 2014-08-09 DIAGNOSIS — Z7982 Long term (current) use of aspirin: Secondary | ICD-10-CM | POA: Insufficient documentation

## 2014-08-09 DIAGNOSIS — Z720 Tobacco use: Secondary | ICD-10-CM | POA: Diagnosis not present

## 2014-08-09 LAB — URINALYSIS COMPLETE WITH MICROSCOPIC (ARMC ONLY)
Bacteria, UA: NONE SEEN
Bilirubin Urine: NEGATIVE
Glucose, UA: NEGATIVE mg/dL
Hgb urine dipstick: NEGATIVE
Ketones, ur: NEGATIVE mg/dL
NITRITE: NEGATIVE
PROTEIN: NEGATIVE mg/dL
Specific Gravity, Urine: 1.013 (ref 1.005–1.030)
pH: 6 (ref 5.0–8.0)

## 2014-08-09 MED ORDER — NITROFURANTOIN MONOHYD MACRO 100 MG PO CAPS: 100.0000 mg | ORAL_CAPSULE | Freq: Two times a day (BID) | ORAL | Status: DC

## 2014-08-09 MED ORDER — KETOROLAC TROMETHAMINE 10 MG PO TABS: 10.0000 mg | ORAL_TABLET | Freq: Three times a day (TID) | ORAL | Status: DC

## 2014-08-09 MED ORDER — KETOROLAC TROMETHAMINE 60 MG/2ML IM SOLN
INTRAMUSCULAR | Status: AC
  Administered 2014-08-09: 60 mg via INTRAMUSCULAR
  Filled 2014-08-09: qty 2

## 2014-08-09 MED ORDER — CYCLOBENZAPRINE HCL 5 MG PO TABS: 5.0000 mg | ORAL_TABLET | Freq: Three times a day (TID) | ORAL | Status: DC | PRN

## 2014-08-09 MED ORDER — KETOROLAC TROMETHAMINE 60 MG/2ML IM SOLN
60.0000 mg | Freq: Once | INTRAMUSCULAR | Status: AC
  Administered 2014-08-09: 60 mg via INTRAMUSCULAR

## 2014-08-09 MED ORDER — ORPHENADRINE CITRATE 30 MG/ML IJ SOLN
INTRAMUSCULAR | Status: AC
Start: 2014-08-09 — End: 2014-08-09
  Administered 2014-08-09: 18:00:00 60 mg via INTRAMUSCULAR
  Filled 2014-08-09: qty 2

## 2014-08-09 MED ORDER — ORPHENADRINE CITRATE 30 MG/ML IJ SOLN
60.0000 mg | INTRAMUSCULAR | Status: AC
  Administered 2014-08-09: 60 mg via INTRAMUSCULAR

## 2014-08-09 NOTE — Discharge Instructions (Signed)
Back Pain, Adult Back pain is very common. The pain often gets better over time. The cause of back pain is usually not dangerous. Most people can learn to manage their back pain on their own.  HOME CARE   Stay active. Start with short walks on flat ground if you can. Try to walk farther each day.  Do not sit, drive, or stand in one place for more than 30 minutes. Do not stay in bed.  Do not avoid exercise or work. Activity can help your back heal faster.  Be careful when you bend or lift an object. Bend at your knees, keep the object close to you, and do not twist.  Sleep on a firm mattress. Lie on your side, and bend your knees. If you lie on your back, put a pillow under your knees.  Only take medicines as told by your doctor.  Put ice on the injured area.  Put ice in a plastic bag.  Place a towel between your skin and the bag.  Leave the ice on for 15-20 minutes, 03-04 times a day for the first 2 to 3 days. After that, you can switch between ice and heat packs.  Ask your doctor about back exercises or massage.  Avoid feeling anxious or stressed. Find good ways to deal with stress, such as exercise. GET HELP RIGHT AWAY IF:   Your pain does not go away with rest or medicine.  Your pain does not go away in 1 week.  You have new problems.  You do not feel well.  The pain spreads into your legs.  You cannot control when you poop (bowel movement) or pee (urinate).  Your arms or legs feel weak or lose feeling (numbness).  You feel sick to your stomach (nauseous) or throw up (vomit).  You have belly (abdominal) pain.  You feel like you may pass out (faint). MAKE SURE YOU:   Understand these instructions.  Will watch your condition.  Will get help right away if you are not doing well or get worse. Document Released: 07/28/2007 Document Revised: 05/03/2011 Document Reviewed: 06/12/2013 [facility] Patient Information 2015 [place], [place]. This information is not intended  to replace advice given to you by your health care provider. Make sure you discuss any questions you have with your health care provider.  Urinary Tract Infection A urinary tract infection (UTI) can occur any place along the urinary tract. The tract includes the kidneys, ureters, bladder, and urethra. A type of germ called bacteria often causes a UTI. UTIs are often helped with antibiotic medicine.  HOME CARE   If given, take antibiotics as told by your doctor. Finish them even if you start to feel better.  Drink enough fluids to keep your pee (urine) clear or pale yellow.  Avoid tea, drinks with caffeine, and bubbly (carbonated) drinks.  Pee often. Avoid holding your pee in for a long time.  Pee before and after having sex (intercourse).  Wipe from front to back after you poop (bowel movement) if you are a woman. Use each tissue only once. GET HELP RIGHT AWAY IF:   You have back pain.  You have lower belly (abdominal) pain.  You have chills.  You feel sick to your stomach (nauseous).  You throw up (vomit).  Your burning or discomfort with peeing does not go away.  You have a fever.  Your symptoms are not better in 3 days. MAKE SURE YOU:   Understand these instructions.  Will watch your  condition.  Will get help right away if you are not doing well or get worse. Document Released: 07/28/2007 Document Revised: 11/03/2011 Document Reviewed: 09/09/2011 [facility] Patient Information 2015 [place], [place]. This information is not intended to replace advice given to you by your health care provider. Make sure you discuss any questions you have with your health care provider.  Take the prescription meds as directed.  Follow-up with [employer] as needed for continued symptoms.

## 2014-08-09 NOTE — ED Provider Notes (Signed)
[facility] Emergency Department Provider Note ____________________________________________  Time seen: 1535  I have reviewed the triage vital signs and the nursing notes.  HISTORY  Chief Complaint  Flank Pain and Urinary Frequency  HPI Tammy Hickman is a 24 y.o. female ED, via EMS, for complaints of bilateral flank pain for the last 2 days. She describes left greater than right flank pain and urinary urgency. She reports when she goes to the restroom she only has dribbling urinary flow. She denies any gross hematuria, malodorous urine, or bright red blood on the toilet tissue. She denies any fever, chills, sweats, or nausea and vomiting. She reports her LMP was 07/24/14.When asked to localize the pain during the interview, she refers to the left  buttock and lumbar-sacral region, and notes referred pain down the back of the thigh stopping at the knee. She claims she took Excedrin and some leftover Bactrim for symptoms relief.   Past Medical History  Diagnosis Date  . Mental disorders of mother, antepartum   . Obesity complicating pregnancy, childbirth, or the puerperium, antepartum condition or complication(649.13)   . Other specified indication for care or intervention related to labor and delivery, antepartum   . Anxiety   . Asthma   . Thyroid dysfunction   . Hernia, umbilical 2011   Patient Active Problem List   Diagnosis Date Noted  . Obesity 07/27/2011  . Contraception, generic surveillance 07/27/2011    Past Surgical History  Procedure Laterality Date  . Tonsillectomy  2009    Current Outpatient Rx  Name  Route  Sig  Dispense  Refill  . amphetamine-dextroamphetamine (ADDERALL) 30 MG tablet   Oral   Take 30 mg by mouth 2 (two) times daily.          Marland Kitchen aspirin 325 MG tablet   Oral   Take 975 mg by mouth every 4 (four) hours as needed for mild pain or moderate pain.         . cyclobenzaprine (FLEXERIL) 5 MG tablet   Oral   Take 1  tablet (5 mg total) by mouth every 8 (eight) hours as needed for muscle spasms.   12 tablet   0   . ibuprofen (ADVIL,MOTRIN) 800 MG tablet   Oral   Take 1 tablet (800 mg total) by mouth every 8 (eight) hours as needed for mild pain or moderate pain.   15 tablet   0   . ketorolac (TORADOL) 10 MG tablet   Oral   Take 1 tablet (10 mg total) by mouth every 8 (eight) hours.   15 tablet   0   . nitrofurantoin, macrocrystal-monohydrate, (MACROBID) 100 MG capsule   Oral   Take 1 capsule (100 mg total) by mouth 2 (two) times daily.   14 capsule   0   . oxyCODONE-acetaminophen (ROXICET) 5-325 MG per tablet   Oral   Take 1 tablet by mouth every 8 (eight) hours as needed for moderate pain or severe pain (Do not drive or operate heavy machinery while taking as can cause drowsiness.).   9 tablet   0   . sulfamethoxazole-trimethoprim (BACTRIM) 400-80 MG per tablet   Oral   Take 2 tablets by mouth 2 (two) times daily. For 10 days   40 tablet   0    Allergies Fentanyl; Hydromorphone; Penicillins; and Prednisone  Family History  Problem Relation Age of Onset  . Cancer Paternal Grandfather     Esophageal  . Heart disease Paternal Grandmother   .  Diabetes Maternal Grandmother   . Heart disease Maternal Grandfather   . Diabetes Maternal Grandfather   . Cancer Maternal Grandfather     Esophageal  . Heart disease Maternal Aunt    Social History History  Substance Use Topics  . Smoking status: Current Some Day Smoker    Last Attempt to Quit: 11/14/2009  . Smokeless tobacco: Never Used  . Alcohol Use: No   Review of Systems  Constitutional: Negative for fever. Eyes: Negative for visual changes. ENT: Negative for sore throat. Cardiovascular: Negative for chest pain. Respiratory: Negative for shortness of breath. Gastrointestinal: Negative for abdominal pain, vomiting and diarrhea. Genitourinary: Positive for dysuria, urgency, and diminished flow. Musculoskeletal: Positive for  lower back pain. Skin: Negative for rash. Neurological: Negative for headaches, focal weakness or numbness. ____________________________________________  PHYSICAL EXAM:  VITAL SIGNS: ED Triage Vitals  Enc Vitals Group     BP 08/09/14 1505 120/62 mmHg     Pulse Rate 08/09/14 1505 78     Resp 08/09/14 1505 20     Temp 08/09/14 1505 98.2 F (36.8 C)     Temp Source 08/09/14 1505 Oral     SpO2 08/09/14 1505 98 %     Weight 08/09/14 1505 255 lb (115.667 kg)     Height 08/09/14 1505 5\' 8"  (1.727 m)     Head Cir --      Peak Flow --      Pain Score 08/09/14 1509 10     Pain Loc --      Pain Edu? --      Excl. in GC? --    Constitutional: Alert and oriented. Well appearing and in no distress. HEENT: Normocephalic and atraumatic. Conjunctivae are normal. PERRL. Normal extraocular movements. Mucous membranes are moist. Hematological/Lymphatic/Immunilogical: No cervical lymphadenopathy. Cardiovascular: Normal rate, regular rhythm.  Respiratory: Normal respiratory effort. No wheezes/rales/rhonchi. Gastrointestinal: Soft and nontender. No distention. No CVA tenderness on exam.  Musculoskeletal: Normal spinal alignment without deformity, spasm, or step-off. Nontender with normal range of motion in all extremities. Normal lumbar ROM, and normal, but slow gait.  Neurologic:  Normal gait without ataxia. Normal speech and language. No gross focal neurologic deficits are appreciated. Negative SLR bilaterally. Normal LE DTRs bilaterally. Normal toe/heel raise.  Skin:  Skin is warm, dry and intact. No rash noted. Psychiatric: Mood and affect are normal. Patient exhibits appropriate insight and judgment. ____________________________________________   LABS (pertinent positives/negatives) Labs Reviewed  URINALYSIS COMPLETEWITH MICROSCOPIC ([facility] ONLY) - Abnormal; Notable for the following:    Color, Urine YELLOW (*)    APPearance HAZY (*)    Leukocytes, UA 3+ (*)    Squamous Epithelial / LPF 6-30  (*)    All other components within normal limits  ____________________________________________  PROCEDURES  Toradol 60 mg IM Norflex 60 mg IM ____________________________________________  INITIAL IMPRESSION / ASSESSMENT AND PLAN / ED COURSE Simple UTI and low back pain with LE referral.  Treatment of acute pain in the ED.  Prescriptions for Toradol, Flexeril, and Macrobid given.  Explicit instructions for completing antibiotic course given.  Follow-up with [employer] as needed. ____________________________________________  FINAL CLINICAL IMPRESSION(S) / ED DIAGNOSES  Final diagnoses:  UTI (lower urinary tract infection)  Left-sided low back pain with left-sided sciatica     Lissa Hoard, PA-C 08/09/14 1902  Sharman Cheek, MD 08/09/14 [phone]

## 2014-08-09 NOTE — ED Notes (Signed)
Patient to ED with c/o bilateral flank pain and urinary frequency but only small amounts for the last few days.

## 2014-08-14 ENCOUNTER — Ambulatory Visit: Payer: Medicaid Other | Admitting: Family Medicine

## 2014-09-01 ENCOUNTER — Encounter: Payer: Self-pay | Admitting: Medical Oncology

## 2014-09-01 ENCOUNTER — Emergency Department
Admission: EM | Admit: 2014-09-01 | Discharge: 2014-09-01 | Disposition: A | Discharge status: Home / Self Care | Payer: Medicaid Other | Attending: Emergency Medicine | Admitting: Emergency Medicine

## 2014-09-01 DIAGNOSIS — Z79899 Other long term (current) drug therapy: Secondary | ICD-10-CM | POA: Diagnosis not present

## 2014-09-01 DIAGNOSIS — Z720 Tobacco use: Secondary | ICD-10-CM | POA: Diagnosis not present

## 2014-09-01 DIAGNOSIS — Z880 Allergy status to penicillin: Secondary | ICD-10-CM | POA: Insufficient documentation

## 2014-09-01 DIAGNOSIS — F1123 Opioid dependence with withdrawal: Secondary | ICD-10-CM | POA: Insufficient documentation

## 2014-09-01 DIAGNOSIS — F1193 Opioid use, unspecified with withdrawal: Secondary | ICD-10-CM

## 2014-09-01 NOTE — ED Notes (Signed)
Pt is now discharged from ED, but her husband is a patient in ED 1, so, rather than physically leaving the ED at this time, pt is in her husband's room.  Husband to be discharged as well.

## 2014-09-01 NOTE — ED Notes (Signed)
BEHAVIORAL HEALTH ROUNDING Patient sleeping: No. Patient alert and oriented: yes Behavior appropriate: Yes.  ; If no, describe:  Nutrition and fluids offered: Yes  Toileting and hygiene offered: Yes  Sitter present: not applicable Law enforcement present: Yes  

## 2014-09-01 NOTE — ED Notes (Addendum)
Pt told RN that she was ready to go "whenever."

## 2014-09-01 NOTE — ED Notes (Signed)
Pt reports that she feels like she is in withdrawals from pain pills, she states that she used to be in symboxone clinic but got kicked out so she returned to using RX pain pills. Requests detox.

## 2014-09-01 NOTE — ED Provider Notes (Signed)
[facility] Provider Note  Time seen: 9:30 AM  I have reviewed the triage vital signs and the nursing notes.   HISTORY  Chief Complaint Withdrawal    HPI Tammy Hickman is a 24 y.o. female presents the emergency department for opiate detox last which are all. According to the patient she has been using prescription pain medications, and has become addicted. She was taking Suboxone at one point and did very well, but she was discharged from her Suboxone clinic for unknown reasons. Patient states she began using oh. Pain pills once again. She comes to the emergency department hoping to detox. Patient denies any medical complaints at this time. Denies any alcohol use or other substance abuse.     Past Medical History  Diagnosis Date  . Mental disorders of mother, antepartum   . Obesity complicating pregnancy, childbirth, or the puerperium, antepartum condition or complication(649.13)   . Other specified indication for care or intervention related to labor and delivery, antepartum   . Anxiety   . Asthma   . Thyroid dysfunction   . Hernia, umbilical 2011    Patient Active Problem List   Diagnosis Date Noted  . Obesity 07/27/2011  . Contraception, generic surveillance 07/27/2011    Past Surgical History  Procedure Laterality Date  . Tonsillectomy  2009    Current Outpatient Rx  Name  Route  Sig  Dispense  Refill  . amphetamine-dextroamphetamine (ADDERALL) 30 MG tablet   Oral   Take 30 mg by mouth 2 (two) times daily.          Marland Kitchen aspirin 325 MG tablet   Oral   Take 975 mg by mouth every 4 (four) hours as needed for mild pain or moderate pain.         . cyclobenzaprine (FLEXERIL) 5 MG tablet   Oral   Take 1 tablet (5 mg total) by mouth every 8 (eight) hours as needed for muscle spasms.   12 tablet   0   . ibuprofen (ADVIL,MOTRIN) 800 MG tablet   Oral   Take 1 tablet (800 mg total) by mouth every 8 (eight) hours  as needed for mild pain or moderate pain.   15 tablet   0   . ketorolac (TORADOL) 10 MG tablet   Oral   Take 1 tablet (10 mg total) by mouth every 8 (eight) hours.   15 tablet   0   . nitrofurantoin, macrocrystal-monohydrate, (MACROBID) 100 MG capsule   Oral   Take 1 capsule (100 mg total) by mouth 2 (two) times daily.   14 capsule   0   . oxyCODONE-acetaminophen (ROXICET) 5-325 MG per tablet   Oral   Take 1 tablet by mouth every 8 (eight) hours as needed for moderate pain or severe pain (Do not drive or operate heavy machinery while taking as can cause drowsiness.).   9 tablet   0   . sulfamethoxazole-trimethoprim (BACTRIM) 400-80 MG per tablet   Oral   Take 2 tablets by mouth 2 (two) times daily. For 10 days   40 tablet   0     Allergies Fentanyl; Hydromorphone; Penicillins; and Prednisone  Family History  Problem Relation Age of Onset  . Cancer Paternal Grandfather     Esophageal  . Heart disease Paternal Grandmother   . Diabetes Maternal Grandmother   . Heart disease Maternal Grandfather   . Diabetes Maternal Grandfather   . Cancer Maternal Grandfather  Esophageal  . Heart disease Maternal Aunt     Social History History  Substance Use Topics  . Smoking status: Current Some Day Smoker    Last Attempt to Quit: 11/14/2009  . Smokeless tobacco: Never Used  . Alcohol Use: No    Review of Systems Constitutional: Negative for fever. Cardiovascular: Negative for chest pain. Respiratory: Negative for shortness of breath. Gastrointestinal: Negative for abdominal pain. Positive for nausea. Last period was 2 weeks ago.  10-point ROS otherwise negative.  ____________________________________________   PHYSICAL EXAM:  VITAL SIGNS: ED Triage Vitals  Enc Vitals Group     BP 09/01/14 0859 132/89 mmHg     Pulse Rate 09/01/14 0859 69     Resp 09/01/14 0859 21     Temp 09/01/14 0859 97.6 F (36.4 C)     Temp Source 09/01/14 0859 Oral     SpO2 09/01/14  0859 99 %     Weight 09/01/14 0859 250 lb (113.399 kg)     Height 09/01/14 0859 5\' 8"  (1.727 m)     Head Cir --      Peak Flow --      Pain Score 09/01/14 0900 10     Pain Loc --      Pain Edu? --      Excl. in GC? --     Constitutional: Alert and oriented. Well appearing and in no distress. ENT   Mouth/Throat: Mucous membranes are moist. Cardiovascular: Normal rate, regular rhythm. No murmur Respiratory: Normal respiratory effort without tachypnea nor retractions. Breath sounds are clear and equal bilaterally. No wheezes/rales/rhonchi. Gastrointestinal: Soft and nontender. No distention.  Musculoskeletal: Nontender with normal range of motion in all extremities Neurologic:  Normal speech and language. No gross focal neurologic deficits  Skin:  Skin is warm, dry and intact.  Psychiatric: Mood and affect are normal. Speech and behavior are normal. ____________________________________________    INITIAL IMPRESSION / ASSESSMENT AND PLAN / ED COURSE  Pertinent labs & imaging results that were available during my care of the patient were reviewed by me and considered in my medical decision making (see chart for details).  Patient here for opiate detox last which all. Patient is requesting Suboxone as that worked very well for her last time. I discussed with the patient that we do not write Suboxone from the emergency department, but we would be happy to refer her to [facility] after a medical clearance exam and treat her with Phenergan as needed for nausea. Patient states she does not want to go to [facility], and GERD has Phenergan at home, she states if she did she if she wanted to go to [facility] she would just go by herself, and is asking to leave. We'll discharge the patient home at this time.  ____________________________________________   FINAL CLINICAL IMPRESSION(S) / ED DIAGNOSES  Opiate withdrawal   Minna AntisKevin Ermal Brzozowski, MD 09/01/14 [phone]

## 2014-09-01 NOTE — Discharge Instructions (Signed)
Opioid Withdrawal Opioids are a group of narcotic drugs. They include the street drug heroin. They also include pain medicines, such as morphine, hydrocodone, oxycodone, and fentanyl. Opioid withdrawal is a group of characteristic physical and mental signs and symptoms. It typically occurs if you have been using opioids daily for several weeks or longer and stop using or rapidly decrease use. Opioid withdrawal can also occur if you have used opioids daily for a long time and are given a medicine to block the effect.  SIGNS AND SYMPTOMS Opioid withdrawal includes three or more of the following symptoms:   Depressed, anxious, or irritable mood.  Nausea or vomiting.  Muscle aches or spasms.   Watery eyes.   Runny nose.  Dilated pupils, sweating, or hairs standing on end.  Diarrhea or intestinal cramping.  Yawning.   Fever.  Increased blood pressure.  Fast pulse.  Restlessness or trouble sleeping. These signs and symptoms occur within several hours of stopping or reducing short-acting opioids, such as heroin. They can occur within 3 days of stopping or reducing long-acting opioids, such as methadone. Withdrawal begins within minutes of receiving a drug that blocks the effects of opioids, such as naltrexone or naloxone. DIAGNOSIS  Opioid use disorder is diagnosed by your health care provider. You will be asked about your symptoms, drug and alcohol use, medical history, and use of medicines. A physical exam may be done. Lab tests may be ordered. Your health care provider may have you see a mental health professional.  TREATMENT  The treatment for opioid withdrawal is usually provided by medical doctors with special training in substance use disorders (addiction specialists). The following medicines may be included in treatment:  Opioids given in place of the abused opioid. They turn on opioid receptors in the brain and lessen or prevent withdrawal symptoms. They are gradually  decreased (opioid substitution and taper).  Non-opioids that can lessen certain opioid withdrawal symptoms. They may be used alone or with opioid substitution and taper. Successful long-term recovery usually requires medicine, counseling, and group support. HOME CARE INSTRUCTIONS   Take medicines only as directed by your health care provider.  Check with your health care provider before starting new medicines.  Keep all follow-up visits as directed by your health care provider. SEEK MEDICAL CARE IF:  You are not able to take your medicines as directed.  Your symptoms get worse.  You relapse. SEEK IMMEDIATE MEDICAL CARE IF:  You have serious thoughts about hurting yourself or others.  You have a seizure.  You lose consciousness. Document Released: 02/11/2003 Document Revised: 06/25/2013 Document Reviewed: 02/21/2013 [facility] Patient Information 2015 [place], [place]. This information is not intended to replace advice given to you by your health care provider. Make sure you discuss any questions you have with your health care provider.     Please follow-up with [facility] as soon as possible for help with her opiate addiction. Return to the emergency department for any personally concerning symptoms.

## 2014-09-01 NOTE — ED Notes (Signed)

## 2014-10-30 ENCOUNTER — Encounter: Payer: Self-pay | Admitting: Emergency Medicine

## 2014-10-30 ENCOUNTER — Emergency Department
Admission: EM | Admit: 2014-10-30 | Discharge: 2014-10-31 | Disposition: A | Discharge status: Home / Self Care | Payer: Medicaid Other | Attending: Emergency Medicine | Admitting: Emergency Medicine

## 2014-10-30 DIAGNOSIS — T40604A Poisoning by unspecified narcotics, undetermined, initial encounter: Secondary | ICD-10-CM

## 2014-10-30 DIAGNOSIS — F341 Dysthymic disorder: Secondary | ICD-10-CM

## 2014-10-30 DIAGNOSIS — F1310 Sedative, hypnotic or anxiolytic abuse, uncomplicated: Secondary | ICD-10-CM | POA: Diagnosis not present

## 2014-10-30 DIAGNOSIS — M546 Pain in thoracic spine: Secondary | ICD-10-CM | POA: Insufficient documentation

## 2014-10-30 DIAGNOSIS — Z880 Allergy status to penicillin: Secondary | ICD-10-CM | POA: Insufficient documentation

## 2014-10-30 DIAGNOSIS — M545 Low back pain: Secondary | ICD-10-CM | POA: Diagnosis not present

## 2014-10-30 DIAGNOSIS — G8929 Other chronic pain: Secondary | ICD-10-CM | POA: Diagnosis not present

## 2014-10-30 DIAGNOSIS — Z79899 Other long term (current) drug therapy: Secondary | ICD-10-CM | POA: Diagnosis not present

## 2014-10-30 DIAGNOSIS — Y998 Other external cause status: Secondary | ICD-10-CM | POA: Diagnosis not present

## 2014-10-30 DIAGNOSIS — Y9389 Activity, other specified: Secondary | ICD-10-CM | POA: Diagnosis not present

## 2014-10-30 DIAGNOSIS — Z3202 Encounter for pregnancy test, result negative: Secondary | ICD-10-CM | POA: Diagnosis not present

## 2014-10-30 DIAGNOSIS — Z720 Tobacco use: Secondary | ICD-10-CM | POA: Diagnosis not present

## 2014-10-30 DIAGNOSIS — T40601A Poisoning by unspecified narcotics, accidental (unintentional), initial encounter: Secondary | ICD-10-CM | POA: Diagnosis not present

## 2014-10-30 DIAGNOSIS — Y9289 Other specified places as the place of occurrence of the external cause: Secondary | ICD-10-CM | POA: Diagnosis not present

## 2014-10-30 DIAGNOSIS — F909 Attention-deficit hyperactivity disorder, unspecified type: Secondary | ICD-10-CM

## 2014-10-30 HISTORY — DX: Bipolar disorder, unspecified: F31.9

## 2014-10-30 LAB — COMPREHENSIVE METABOLIC PANEL
ALT: 14 U/L (ref 14–54)
ANION GAP: 8 (ref 5–15)
AST: 15 U/L (ref 15–41)
Albumin: 4.6 g/dL (ref 3.5–5.0)
Alkaline Phosphatase: 57 U/L (ref 38–126)
BUN: 18 mg/dL (ref 6–20)
CO2: 26 mmol/L (ref 22–32)
Calcium: 9.2 mg/dL (ref 8.9–10.3)
Chloride: 106 mmol/L (ref 101–111)
Creatinine, Ser: 0.83 mg/dL (ref 0.44–1.00)
GFR calc Af Amer: >60 mL/min (ref >60)
Glucose, Bld: 85 mg/dL (ref 65–99)
POTASSIUM: 4.1 mmol/L (ref 3.5–5.1)
SODIUM: 140 mmol/L (ref 135–145)
Total Bilirubin: 0.5 mg/dL (ref 0.3–1.2)
Total Protein: 8.6 g/dL — ABNORMAL HIGH (ref 6.5–8.1)

## 2014-10-30 LAB — CBC WITH DIFFERENTIAL/PLATELET
BASOS ABS: 0.1 10*3/uL (ref 0–0.1)
BASOS PCT: 0 %
EOS ABS: 0.1 10*3/uL (ref 0–0.7)
Eosinophils Relative: 1 %
HEMATOCRIT: 43.8 % (ref 35.0–47.0)
HEMOGLOBIN: 14 g/dL (ref 12.0–16.0)
Lymphocytes Relative: 15 %
Lymphs Abs: 2.2 10*3/uL (ref 1.0–3.6)
MCH: 27.1 pg (ref 26.0–34.0)
MCHC: 32 g/dL (ref 32.0–36.0)
MCV: 84.7 fL (ref 80.0–100.0)
MONOS PCT: 5 %
Monocytes Absolute: 0.7 10*3/uL (ref 0.2–0.9)
NEUTROS ABS: 11.5 10*3/uL — AB (ref 1.4–6.5)
NEUTROS PCT: 79 %
Platelets: 302 10*3/uL (ref 150–440)
RBC: 5.17 MIL/uL (ref 3.80–5.20)
RDW: 15.7 % — ABNORMAL HIGH (ref 11.5–14.5)
WBC: 14.6 10*3/uL — AB (ref 3.6–11.0)

## 2014-10-30 LAB — ETHANOL: Alcohol, Ethyl (B): <5 mg/dL (ref <5)

## 2014-10-30 LAB — SALICYLATE LEVEL

## 2014-10-30 LAB — ACETAMINOPHEN LEVEL: Acetaminophen (Tylenol), Serum: <10 ug/mL — ABNORMAL LOW (ref 10–30)

## 2014-10-30 MED ORDER — SODIUM CHLORIDE 0.9 % IV BOLUS (SEPSIS)
1000.0000 mL | Freq: Once | INTRAVENOUS | Status: AC
  Administered 2014-10-30: 1000 mL via INTRAVENOUS

## 2014-10-30 NOTE — ED Provider Notes (Signed)
[facility] Emergency Department Provider Note   ____________________________________________  Time seen: On arrival 7:15 PM I have reviewed the triage vital signs and the triage nursing note.  HISTORY  Chief Complaint Drug Overdose   Historian Limited: Patient, who is a poor historian HPI Tammy Hickman is a 24 y.o. female was brought in by EMS after EMS was called due to patient being found unresponsive at her home. Apparently when EMS arrived they found her cyanotic, unresponsive to stimuli, with pinpoint nonreactive pupils. After she was given intranasal Narcan, she did wake up and become alert and oriented. She reports that she took Vicodin tablets which she obtained from someone else, as well as gabapentin in order to relieve her chronic back pain. She cannot tell me what strength the Vicodin tablet for or if they were indeed actually Vicodin. She is also unable to tell me how many pills or even if it was like 2 or 10, of either the Vicodin or the gabapentin. She reports a history of depression in the past, but states her moods have been "fine." She states that she did not do this was suicidal intent, but in effort to relieve back pain.    Past Medical History  Diagnosis Date  . Mental disorders of mother, antepartum   . Obesity complicating pregnancy, childbirth, or the puerperium, antepartum condition or complication(649.13)   . Other specified indication for care or intervention related to labor and delivery, antepartum   . Anxiety   . Asthma   . Thyroid dysfunction   . Hernia, umbilical 2011  . Bipolar disorder     Patient Active Problem List   Diagnosis Date Noted  . Obesity 07/27/2011  . Contraception, generic surveillance 07/27/2011    Past Surgical History  Procedure Laterality Date  . Tonsillectomy  2009  . Hernia repair      umbilical hernia    Current Outpatient Rx  Name  Route  Sig  Dispense  Refill  .  amphetamine-dextroamphetamine (ADDERALL XR) 15 MG 24 hr capsule   Oral   Take 15 mg by mouth 2 (two) times daily.         . ARIPiprazole (ABILIFY) 5 MG tablet   Oral   Take 2.5-5 mg by mouth daily.         . clonazePAM (KLONOPIN) 1 MG tablet   Oral   Take 1 mg by mouth 4 (four) times daily.         Marland Kitchen amphetamine-dextroamphetamine (ADDERALL) 30 MG tablet   Oral   Take 30 mg by mouth 2 (two) times daily.          Marland Kitchen ibuprofen (ADVIL,MOTRIN) 800 MG tablet   Oral   Take 1 tablet (800 mg total) by mouth every 8 (eight) hours as needed for mild pain or moderate pain.   15 tablet   0   . nitrofurantoin, macrocrystal-monohydrate, (MACROBID) 100 MG capsule   Oral   Take 1 capsule (100 mg total) by mouth 2 (two) times daily.   14 capsule   0   . oxyCODONE-acetaminophen (ROXICET) 5-325 MG per tablet   Oral   Take 1 tablet by mouth every 8 (eight) hours as needed for moderate pain or severe pain (Do not drive or operate heavy machinery while taking as can cause drowsiness.).   9 tablet   0   . sulfamethoxazole-trimethoprim (BACTRIM) 400-80 MG per tablet   Oral   Take 2 tablets by mouth 2 (two) times daily.  For 10 days Patient not taking: Reported on 10/30/2014   40 tablet   0     Allergies Fentanyl; Hydromorphone; Penicillins; and Prednisone  Family History  Problem Relation Age of Onset  . Cancer Paternal Grandfather     Esophageal  . Heart disease Paternal Grandmother   . Diabetes Maternal Grandmother   . Heart disease Maternal Grandfather   . Diabetes Maternal Grandfather   . Cancer Maternal Grandfather     Esophageal  . Heart disease Maternal Aunt     Social History Social History  Substance Use Topics  . Smoking status: Current Some Day Smoker -- 0.20 packs/day    Last Attempt to Quit: 11/14/2009  . Smokeless tobacco: Never Used  . Alcohol Use: No    Review of Systems  Constitutional: Negative for fever. Eyes: Negative for visual changes. ENT:  Negative for sore throat. Cardiovascular: Negative for chest pain. Respiratory: Negative for shortness of breath. Gastrointestinal: Negative for abdominal pain, vomiting and diarrhea. Genitourinary: Negative for dysuria. Musculoskeletal: Positive for chronic mid and low back pain Skin: Negative for rash. Neurological: Negative for headache. 10 point Review of Systems otherwise negative ____________________________________________   PHYSICAL EXAM:  VITAL SIGNS: ED Triage Vitals  Enc Vitals Group     BP 10/30/14 1902 128/85 mmHg     Pulse Rate 10/30/14 1902 93     Resp 10/30/14 1902 18     Temp 10/30/14 1902 98 F (36.7 C)     Temp src --      SpO2 10/30/14 1902 98 %     Weight 10/30/14 1902 220 lb (99.791 kg)     Height 10/30/14 1902 5\' 7"  (1.702 m)     Head Cir --      Peak Flow --      Pain Score 10/30/14 1903 5     Pain Loc --      Pain Edu? --      Excl. in GC? --      Constitutional: Alert and oriented. In no acute distress. Eyes: Conjunctivae are normal. PERRL. 4 mm to 2 bilaterally. Normal extraocular movements. ENT   Head: Normocephalic and atraumatic.   Nose: No congestion/rhinnorhea.   Mouth/Throat: Mucous membranes are moist.   Neck: No stridor. Cardiovascular/Chest: Normal rate, regular rhythm.  No murmurs, rubs, or gallops. Respiratory: Normal respiratory effort without tachypnea nor retractions. Breath sounds are clear and equal bilaterally. No wheezes/rales/rhonchi. Gastrointestinal: Soft. No distention, no guarding, no rebound. Nontender . Obese.  Genitourinary/rectal:Deferred Musculoskeletal: Nontender with normal range of motion in all extremities. No joint effusions.  No lower extremity tenderness.  No edema. Neurologic:  Normal speech and language. No gross or focal neurologic deficits are appreciated. Skin:  Skin is warm, dry and intact. No rash noted. Psychiatric: Mood appears somewhat depressed, all of this she is denying depression.  Her affect is flat.  Speech and behavior are normal. Patient exhibits fair insight and judgment, however does not seem to be that concerned about what was potentially a near fatal overdose.  ____________________________________________   EKG I, Governor Rooks, MD, the attending physician have personally viewed and interpreted all ECGs.  No EKG performed ____________________________________________  LABS (pertinent positives/negatives)  Comprehensive metabolic panel within normal limits Alcohol less than 5 Tylenol less than 10 Still slightly less than 4 White blood count 14.6, hemoglobin 14.0 Urinalysis pending urine drug screens pending  ____________________________________________  RADIOLOGY All Xrays were viewed by me. Imaging interpreted by Radiologist.  None __________________________________________  PROCEDURES  Procedure(s) performed: None  Critical Care performed: CRITICAL CARE Performed by: Governor Rooks   Total critical care time: 35 minutes  Critical care time was exclusive of separately billable procedures and treating other patients.  Critical care was necessary to treat or prevent imminent or life-threatening deterioration.  Critical care was time spent personally by me on the following activities: development of treatment plan with patient and/or surrogate as well as nursing, discussions with consultants, evaluation of patient's response to treatment, examination of patient, obtaining history from patient or surrogate, ordering and performing treatments and interventions, ordering and review of laboratory studies, ordering and review of radiographic studies, pulse oximetry and re-evaluation of patient's condition.   ____________________________________________   ED COURSE / ASSESSMENT AND PLAN  CONSULTATIONS: Face-to-face with Psychiatry intake Child psychotherapist. Psychiatrist consultation is pending.  Pertinent labs & imaging results that were available  during my care of the patient were reviewed by me and considered in my medical decision making (see chart for details).  Patient was seen immediately upon arrival after reported overdose. It sounds like there was a narcotic overdose in the patient thinks she took tablets which were supposedly Vicodin, and she had symptoms of narcotic overdose which did reverse after administration by EMS of Narcan.  Although the patient's denying taking the medication in a suicide attempt, and that her moods have been fair, she is a relatively poor historian, and has a flat affect, and I'm not exactly sure if she is withholding information, or is having extremely poor understanding of how life-threatening her choices were today. With a history depression, and a flat affect, and at times she seems to possibly even being withholding information such as how many pills she might have taken in general, I am going to place her on involuntary commitment so that she is evaluated by a psychiatrist prior to discharge.  I have seen and treated her boyfriend recently, who has paralysis and chronic illness, and I had heard then and now again today about even more additional social stressors including their car not working, her phone not working, trouble obtaining medical care for evaluation of chronic back pain, that  I think that she is at risk for depression, and if not intentional suicidal behavior, then still apathy and poor decision making with potentially life-threatening consequences for which I would like her to have psychiatric evaluation formally.  Critical care was based on time spent assessing the life-threatening nature of her overdose, monitoring for additional/return of symptoms, and coordinating psychiatric care.   ----------------------------------------- 1:10 AM on 10/31/2014 -----------------------------------------  No return of somnolence/overdose symptoms. Patient medically clear. Urinalysis and UDS are  pending. Patient will be assessed by psychiatry tomorrow. Patient care transferred to Dr. Derrill Kay, overnight physician.  Patient / Family / Caregiver informed of clinical course, medical decision-making process, and agree with plan.    ___________________________________________   FINAL CLINICAL IMPRESSION(S) / ED DIAGNOSES   Final diagnoses:  Overdose of opiate or related narcotic, undetermined intent, initial encounter       Governor Rooks, MD 10/31/14 0111

## 2014-10-30 NOTE — B[facility] Counselor (Signed)
Writer faxed (fax# [phone]) IVC paperwork as requested to [facility] TTS (Terri S.) for TTS tele-assessment.

## 2014-10-30 NOTE — ED Notes (Signed)
PT comes into the ED via EMS due to being unresponsive from a drug overdose.  Patient took an unknown amount of vicodin and gabapentin in order to relieve her back pain.  Upon arrival of EMS they stated she had pinpoint and non reactive pupils, was cyanotic, and unresponsive.  Given  of narcan intranasally.  Patient immediately woke up and became A&Ox4.  VS:  106 HR, 120/72 BP, 97% room air.  Patient currently complaining of back pain.

## 2014-10-30 NOTE — [facility] Counselor (Signed)
Writer placed Cart 2 in Pt room for TTS tele-assessment.

## 2014-10-30 NOTE — ED Notes (Signed)
Had lengthy conversation with patient and her husband regarding POC. Husband advising that patient is the sole caregiver for him; advising that he is a paraplegic. Husband wanting to tell this nurse his wife's "story". Patient reportedly had an epidural x 4 years ago when she had her child - husband states, "they fucked up her back". Husband reports that patient took 2 Vicodin and some other medication because "she lives in hell". Patient advised husband just now that one of her friend's gave her what she believed to be a [employer] manager", however husband questioning whether or not it was some form of Fentanyl; states "she is deathly allergic to it and it makes her act like she did tonight." Husband admits that he "routinely gives her his pain pills, but it never does this to her". RN explained that patient would be staying here at least over night due to her needing to see the psychiatrist in the morning. Husband has also spoken with Dr. Shaune Pollack on several occassions and has been informed that patient would also be staying due to need for monitoring secondary to drug overdose and the fact that she is s/p Narcan administration. Husband understands that the Narcan can "wear off" and that patient could experience profound sedative effects that could lead to depressed respiratory drive causing her to stop breathing. Husband to call in the am for an update regarding POC for his wife. Dr. Shaune Pollack updated on conversation with patient and her husband.

## 2014-10-30 NOTE — ED Notes (Signed)
Attempted to get IV and blood work started on the patient.  Patient declined all blood work and stated "I just want to go home, no one in this hospital is going to fucking help me".  MD notified that patient is declining medical treatment at this time and only wants to go home.

## 2014-10-30 NOTE — ED Notes (Signed)
MD at bedside. 

## 2014-10-30 NOTE — ED Notes (Signed)

## 2014-10-30 NOTE — BH Assessment (Addendum)
Tele Assessment Note   Tammy Hickman is a 24 y.o. female who presents to [facility] via IVC petition, initiated by EDP, Governor Rooks, MD.  Pt brought in by EMS for an unintentional overdose.  Pt states the following: she denies SI/HI/AVH.  Pt says she has chronic back pain and says no one is trying to help her--"I was trying to help myself, since no one else will help me".  Pt does not attend a pain mgt clinic.  Pt took an unk amount of vicodin but denies taking anything else, however pt may have taken gabapentin.  Pt denies past SI attempts.  Pt reports chronic back pain x79yrs. Pt contracted for safety with this Clinical research associate.  Axis I: ADHD, combined type and Bipolar D/O  Axis II: Deferred Axis III:  Past Medical History  Diagnosis Date  . Mental disorders of mother, antepartum   . Obesity complicating pregnancy, childbirth, or the puerperium, antepartum condition or complication(649.13)   . Other specified indication for care or intervention related to labor and delivery, antepartum   . Anxiety   . Asthma   . Thyroid dysfunction   . Hernia, umbilical 2011  . Bipolar disorder    Axis IV: other psychosocial or environmental problems, problems related to social environment, problems with access to health care services and problems with primary support group Axis V: 41-50 serious symptoms  Past Medical History:  Past Medical History  Diagnosis Date  . Mental disorders of mother, antepartum   . Obesity complicating pregnancy, childbirth, or the puerperium, antepartum condition or complication(649.13)   . Other specified indication for care or intervention related to labor and delivery, antepartum   . Anxiety   . Asthma   . Thyroid dysfunction   . Hernia, umbilical 2011  . Bipolar disorder     Past Surgical History  Procedure Laterality Date  . Tonsillectomy  2009  . Hernia repair      umbilical hernia    Family History:  Family History  Problem Relation Age of Onset  . Cancer  Paternal Grandfather     Esophageal  . Heart disease Paternal Grandmother   . Diabetes Maternal Grandmother   . Heart disease Maternal Grandfather   . Diabetes Maternal Grandfather   . Cancer Maternal Grandfather     Esophageal  . Heart disease Maternal Aunt     Social History:  reports that she has been smoking.  She has never used smokeless tobacco. She reports that she uses illicit drugs. She reports that she does not drink alcohol.  Additional Social History:  Alcohol / Drug Use Pain Medications: See MAR  Prescriptions: See MAR  Over the Counter: See MAR  History of alcohol / drug use?: No history of alcohol / drug abuse Longest period of sobriety (when/how long): Pt denies SA use   CIWA: CIWA-Ar BP: 128/85 mmHg Pulse Rate: 93 COWS:    PATIENT STRENGTHS: (choose at least two) Communication skills  Allergies:  Allergies  Allergen Reactions  . Fentanyl Hives  . Hydromorphone Hives  . Penicillins Other (See Comments)    Chest pains  . Prednisone     unknwon    Home Medications:  (Not in a hospital admission)  OB/GYN Status:  Patient's last menstrual period was 10/14/2014 (approximate).  General Assessment Data Location of Assessment: [facility]A TTS Assessment: In system Is this a Tele or Face-to-Face Assessment?: Tele Assessment Is this an Initial Assessment or a Re-assessment for this encounter?: Initial Assessment Marital status: Married [place]  name: None  Is patient pregnant?: No Pregnancy Status: No Living Arrangements: Spouse/significant other, Children Can pt return to current living arrangement?: Yes Admission Status: Involuntary Is patient capable of signing voluntary admission?: No Referral Source: MD Insurance type: MCD  Medical Screening Exam [facility] Walk-in ONLY) Medical Exam completed: No Reason for MSE not completed: Other: (None )  Crisis Care Plan Living Arrangements: Spouse/significant other, Children Name of Psychiatrist: "Do not want to  discuss"  Name of Therapist: None   Education Status Is patient currently in school?: No Current Grade: None  Highest grade of school patient has completed: None  Name of school: None  Contact person: None   Risk to self with the past 6 months Suicidal Ideation: No Has patient been a risk to self within the past 6 months prior to admission? : No Suicidal Intent: No Has patient had any suicidal intent within the past 6 months prior to admission? : No Is patient at risk for suicide?: No Suicidal Plan?: No Has patient had any suicidal plan within the past 6 months prior to admission? : No Access to Means: No What has been your use of drugs/alcohol within the last 12 months?: Pt denies  Previous Attempts/Gestures: No How many times?: 0 Other Self Harm Risks: None  Triggers for Past Attempts: None known Intentional Self Injurious Behavior: None Family Suicide History: No Recent stressful life event(s): Recent negative physical changes (C/O chronic back pain ) Persecutory voices/beliefs?: No Depression: No Depression Symptoms:  (None reported ) Substance abuse history and/or treatment for substance abuse?: No Suicide prevention information given to non-admitted patients: Not applicable  Risk to Others within the past 6 months Homicidal Ideation: No Does patient have any lifetime risk of violence toward others beyond the six months prior to admission? : No Thoughts of Harm to Others: No Current Homicidal Intent: No Current Homicidal Plan: No Access to Homicidal Means: No Identified Victim: None  History of harm to others?: No Assessment of Violence: None Noted Violent Behavior Description: None  Does patient have access to weapons?: No Criminal Charges Pending?: No Does patient have a court date: No Is patient on probation?: No  Psychosis Hallucinations: None noted Delusions: None noted  Mental Status Report Appearance/Hygiene: In hospital gown Eye Contact: Fair Motor  Activity: Unremarkable Speech: Logical/coherent Level of Consciousness: Alert Mood: Other (Comment) (Appropriate ) Affect: Appropriate to circumstance Anxiety Level: None Thought Processes: Coherent, Relevant Judgement: Unimpaired Orientation: Person, Place, Time, Situation Obsessive Compulsive Thoughts/Behaviors: None  Cognitive Functioning Concentration: Normal Memory: Recent Intact, Remote Intact IQ: Average Insight: Good Impulse Control: Poor Appetite: Good Weight Loss: 0 Weight Gain: 0 Sleep: No Change Total Hours of Sleep: 6 Vegetative Symptoms: None  ADLScreening [facility] Assessment Services) Patient's cognitive ability adequate to safely complete daily activities?: Yes Patient able to express need for assistance with ADLs?: Yes Independently performs ADLs?: Yes (appropriate for developmental age)  Prior Inpatient Therapy Prior Inpatient Therapy: No Prior Therapy Dates: None  Prior Therapy Facilty/Provider(s): None  Reason for Treatment: None   Prior Outpatient Therapy Prior Outpatient Therapy: Yes Prior Therapy Dates: Current  Prior Therapy Facilty/Provider(s): "Do not want to discuss"  Reason for Treatment: Med Mgt  Does patient have an ACCT team?: No Does patient have Intensive In-House Services?  : No Does patient have [facility] services? : No Does patient have P4CC services?: No  ADL Screening (condition at time of admission) Patient's cognitive ability adequate to safely complete daily activities?: Yes Is the patient deaf or have difficulty  hearing?: No Does the patient have difficulty seeing, even when wearing glasses/contacts?: No Does the patient have difficulty concentrating, remembering, or making decisions?: No Patient able to express need for assistance with ADLs?: Yes Does the patient have difficulty dressing or bathing?: No Independently performs ADLs?: Yes (appropriate for developmental age) Does the patient have difficulty walking or climbing  stairs?: No Weakness of Legs: None Weakness of Arms/Hands: None  Home Assistive Devices/Equipment Home Assistive Devices/Equipment: None  Therapy Consults (therapy consults require a physician order) PT Evaluation Needed: No OT Evalulation Needed: No SLP Evaluation Needed: No Abuse/Neglect Assessment (Assessment to be complete while patient is alone) Physical Abuse: Denies Verbal Abuse: Denies Sexual Abuse: Denies Exploitation of patient/patient's resources: Denies Self-Neglect: Denies Values / Beliefs Cultural Requests During Hospitalization: None Spiritual Requests During Hospitalization: None Consults Spiritual Care Consult Needed: No Social Work Consult Needed: No Merchant navy officer (For Healthcare) Does patient have an advance directive?: No Would patient like information on creating an advanced directive?: No - patient declined information    Additional Information 1:1 In Past 12 Months?: No CIRT Risk: No Elopement Risk: No Does patient have medical clearance?: Yes     Disposition:  Disposition Initial Assessment Completed for this Encounter: Yes Disposition of Patient: Referred to (Pt to be reviewed by [facility] by psych physician ) Patient referred to: Other (Comment) (Pt to be reviewed [facility] psych physician )  Beatrix Shipper C 10/30/2014 11:03 PM

## 2014-10-30 NOTE — ED Notes (Signed)
Meal tray provided per patient request. Patient has previously been provided with several packs of crackers and peanut butter.

## 2014-10-30 NOTE — ED Notes (Signed)
Patient transferred to ED 25 from ED 4 at this time. Care of patient assumed by this RN. Patient upset about having to stay; speaking with husband on the phone now. Patient overhead telling husband that she "does not need to stay" and that the staff and doctor here were a "bunch of stupid assholes". Patient states, "I am not staying here tonight on this hard bed. If you want me to stay then you need to put me in a real room". Of note, patient in an ED room at this time. POC explained to patient - advised that she would be seeing [facility] RN tonight and that a psychiatrist would see her in the morning. Patient updated on [facility] protocols, including visitation hours, phone use, snack and meal times; patient upset and continues to curse. MD made aware. Patient asking for food - no trays in the ED at this time - will have tray sent up for patient. No further verbalized needs.

## 2014-10-31 DIAGNOSIS — T40601A Poisoning by unspecified narcotics, accidental (unintentional), initial encounter: Secondary | ICD-10-CM

## 2014-10-31 DIAGNOSIS — F909 Attention-deficit hyperactivity disorder, unspecified type: Secondary | ICD-10-CM

## 2014-10-31 DIAGNOSIS — F341 Dysthymic disorder: Secondary | ICD-10-CM

## 2014-10-31 LAB — URINALYSIS COMPLETE WITH MICROSCOPIC (ARMC ONLY)
BILIRUBIN URINE: NEGATIVE
Glucose, UA: NEGATIVE mg/dL
HGB URINE DIPSTICK: NEGATIVE
Ketones, ur: NEGATIVE mg/dL
Nitrite: NEGATIVE
PH: 5 (ref 5.0–8.0)
Protein, ur: 30 mg/dL — AB
SPECIFIC GRAVITY, URINE: 1.031 — AB (ref 1.005–1.030)

## 2014-10-31 LAB — URINE DRUG SCREEN, QUALITATIVE (ARMC ONLY)
Amphetamines, Ur Screen: NOT DETECTED
BARBITURATES, UR SCREEN: NOT DETECTED
BENZODIAZEPINE, UR SCRN: POSITIVE — AB
Cannabinoid 50 Ng, Ur ~~LOC~~: NOT DETECTED
Cocaine Metabolite,Ur ~~LOC~~: NOT DETECTED
MDMA (Ecstasy)Ur Screen: NOT DETECTED
METHADONE SCREEN, URINE: NOT DETECTED
Opiate, Ur Screen: POSITIVE — AB
Phencyclidine (PCP) Ur S: NOT DETECTED
Tricyclic, Ur Screen: NOT DETECTED

## 2014-10-31 LAB — POCT PREGNANCY, URINE: Preg Test, Ur: NEGATIVE

## 2014-10-31 MED ORDER — NALOXONE HCL 1 MG/ML IJ SOLN: 0.4000 mg | INTRAMUSCULAR | Status: DC | PRN

## 2014-10-31 NOTE — ED Notes (Signed)
BEHAVIORAL HEALTH ROUNDING Patient sleeping: No. Patient alert and oriented: yes Behavior appropriate: Yes.  ; If no, describe:  Nutrition and fluids offered: Yes  Toileting and hygiene offered: Yes  Sitter present: no Law enforcement present: Yes  

## 2014-10-31 NOTE — ED Notes (Signed)
Dr clapacs at bedside 

## 2014-10-31 NOTE — ED Notes (Signed)
Pt discharged home after verbalizing understanding of discharge instructions; nad noted. 

## 2014-10-31 NOTE — ED Notes (Signed)
Patient continues to rest in room with NAD noted. Sleeping at this time with even and non-labored respiratory pattern noted. No anticipated needs identified. Will continue to monitor.  

## 2014-10-31 NOTE — Discharge Instructions (Signed)
Follow-up with [place] behavioral care doctor Su. Please return for any other problems

## 2014-10-31 NOTE — Consult Note (Signed)
[facility] Face-to-Face Psychiatry Consult   Reason for Consult:  Consult for this 24 year old woman brought into the hospital after an overdose of opiate medication. Placed under commitment by Dr. Reita Cliche Referring Physician:  Reita Cliche Patient Identification: Tammy Hickman MRN:  [id number] Principal Diagnosis: Opiate overdose Diagnosis:   Patient Active Problem List   Diagnosis Date Noted  . Opiate overdose [T40.601A] 10/31/2014  . Dysthymia [F34.1] 10/31/2014  . ADHD (attention deficit hyperactivity disorder) [F90.9] 10/31/2014  . Obesity [E66.9] 07/27/2011  . Contraception, generic surveillance [Z30.40] 07/27/2011    Total Time spent with patient: 1 hour  Subjective:   Tammy Hickman is a 24 y.o. female patient admitted with "I took a bunch of pain medicine because nobody will help me with my pain".  HPI:  Information from the patient and the chart. Patient came to the emergency room last night after being found unresponsive at home. She responded to Narcan in the emergency room. Patient admits that she took an excessive number of opiate pain medicine. She thinks that when she took was Vicodin. She also took some gabapentin with them. She is a little unclear about how many pills there were. She has insisted consistently that she did this only because she was having such terrible pain. She reports she has pain in her lower back and going down her right leg. This is chronic pain but has been worse for the last couple weeks. Patient denies there was any suicidal thinking at all recently. She does have some chronic sleep problems. She has been treated for "bipolar disorder" by Dr.Su and says that her mood is always rather poor but denies that she's been more depressed than usual. Denies any psychotic symptoms. Patient's chief complaint stress is related to her pain but also from having to take care of her child at home and take care of her husband who is permanently disabled. Patient admits that  the pain medicine she took were not hers along to her husband. She says she is currently taking Adderall Abilify and Klonopin from her primary psychiatrist.  Past psychiatric history: Denies any past psychiatric hospitalizations. Denies any history of suicide attempts. Sees Dr. Kasandra Knudsen as an outpatient and has been diagnosed with bipolar disorder and attention deficit disorder.  Social history: Patient does not work outside the home. She lives with her husband who is a paraplegic. They have one child at home. Patient is stressed by doing all of the housework there.  Medical history: Patient complains of chronic leg pain. Otherwise she denies having any known significant ongoing medical problems.  Family history: Says she has a brother with multiple medical problems  Substance abuse history: She denies that she drinks alcohol at all. Minimizes any drug use and says that she hasn't abused drugs in a long time.  Current medications: She reports she takes Adderall Abilify and clonazepam  Laboratory results: Labs reviewed. Possible urinary tract infection. Drug screen is positive for benzodiazepine's and opiates. I would note that the opiates is explainable by taking Vicodin although the fact that her acetaminophen level is undetectable raises a question of whether it was really Vicodin. I would also note that usually Klonopin does not show up on a benzodiazepine drug screen and that she is not showing any signs of her suppose that Adderall.  HPI Elements:   Quality:  Overdose on opiates. Severity:  Moderate to severe potentially life threatening. Timing:  Happened last night acutely. Duration:  Resolved at this point. Context:  Chronic pain.Marland Kitchen  Past Medical History:  Past Medical History  Diagnosis Date  . Mental disorders of mother, antepartum   . Obesity complicating pregnancy, childbirth, or the puerperium, antepartum condition or complication(649.13)   . Other specified indication for care or  intervention related to labor and delivery, antepartum   . Anxiety   . Asthma   . Thyroid dysfunction   . Hernia, umbilical 0960  . Bipolar disorder     Past Surgical History  Procedure Laterality Date  . Tonsillectomy  2009  . Hernia repair      umbilical hernia   Family History:  Family History  Problem Relation Age of Onset  . Cancer Paternal Grandfather     Esophageal  . Heart disease Paternal Grandmother   . Diabetes Maternal Grandmother   . Heart disease Maternal Grandfather   . Diabetes Maternal Grandfather   . Cancer Maternal Grandfather     Esophageal  . Heart disease Maternal Aunt    Social History:  History  Alcohol Use No     History  Drug Use  . Yes    Comment: Pain pills    Social History   Social History  . Marital Status: Married    Spouse Name: N/A  . Number of Children: N/A  . Years of Education: N/A   Social History Main Topics  . Smoking status: Current Some Day Smoker -- 0.20 packs/day    Last Attempt to Quit: 11/14/2009  . Smokeless tobacco: Never Used  . Alcohol Use: No  . Drug Use: Yes     Comment: Pain pills  . Sexual Activity:    Partners: Male    Birth Control/ Protection: Condom   Other Topics Concern  . None   Social History Narrative   Additional Social History:    Pain Medications: See MAR  Prescriptions: See MAR  Over the Counter: See MAR  History of alcohol / drug use?: No history of alcohol / drug abuse Longest period of sobriety (when/how long): Pt denies SA use                      Allergies:   Allergies  Allergen Reactions  . Fentanyl Hives  . Hydromorphone Hives  . Penicillins Other (See Comments)    Chest pains  . Prednisone     unknwon    Labs:  Results for orders placed or performed during the hospital encounter of 10/30/14 (from the past 48 hour(s))  Comprehensive metabolic panel     Status: Abnormal   Collection Time: 10/30/14  8:52 PM  Result Value Ref Range   Sodium 140 135 - 145  mmol/L   Potassium 4.1 3.5 - 5.1 mmol/L   Chloride 106 101 - 111 mmol/L   CO2 26 22 - 32 mmol/L   Glucose, Bld 85 65 - 99 mg/dL   BUN 18 6 - 20 mg/dL   Creatinine, Ser 0.83 0.44 - 1.00 mg/dL   Calcium 9.2 8.9 - 10.3 mg/dL   Total Protein 8.6 (H) 6.5 - 8.1 g/dL   Albumin 4.6 3.5 - 5.0 g/dL   AST 15 15 - 41 U/L   ALT 14 14 - 54 U/L   Alkaline Phosphatase 57 38 - 126 U/L   Total Bilirubin 0.5 0.3 - 1.2 mg/dL   GFR calc non Af Amer >60 >60 mL/min   GFR calc Af Amer >60 >60 mL/min    Comment: (NOTE) The eGFR has been calculated using the CKD EPI equation. This  calculation has not been validated in all clinical situations. eGFR's persistently <60 mL/min signify possible Chronic Kidney Disease.    Anion gap 8 5 - 15  Ethanol     Status: None   Collection Time: 10/30/14  8:52 PM  Result Value Ref Range   Alcohol, Ethyl (B) <5 <5 mg/dL    Comment:        LOWEST DETECTABLE LIMIT FOR SERUM ALCOHOL IS 5 mg/dL FOR MEDICAL PURPOSES ONLY   Acetaminophen level     Status: Abnormal   Collection Time: 10/30/14  8:52 PM  Result Value Ref Range   Acetaminophen (Tylenol), Serum <10 (L) 10 - 30 ug/mL    Comment:        THERAPEUTIC CONCENTRATIONS VARY SIGNIFICANTLY. A RANGE OF 10-30 ug/mL MAY BE AN EFFECTIVE CONCENTRATION FOR MANY PATIENTS. HOWEVER, SOME ARE BEST TREATED AT CONCENTRATIONS OUTSIDE THIS RANGE. ACETAMINOPHEN CONCENTRATIONS >150 ug/mL AT 4 HOURS AFTER INGESTION AND >50 ug/mL AT 12 HOURS AFTER INGESTION ARE OFTEN ASSOCIATED WITH TOXIC REACTIONS.   Salicylate level     Status: None   Collection Time: 10/30/14  8:52 PM  Result Value Ref Range   Salicylate Lvl <1.2 2.8 - 30.0 mg/dL  CBC with Differential     Status: Abnormal   Collection Time: 10/30/14  8:52 PM  Result Value Ref Range   WBC 14.6 (H) 3.6 - 11.0 K/uL   RBC 5.17 3.80 - 5.20 MIL/uL   Hemoglobin 14.0 12.0 - 16.0 g/dL   HCT 43.8 35.0 - 47.0 %   MCV 84.7 80.0 - 100.0 fL   MCH 27.1 26.0 - 34.0 pg   MCHC 32.0  32.0 - 36.0 g/dL   RDW 15.7 (H) 11.5 - 14.5 %   Platelets 302 150 - 440 K/uL   Neutrophils Relative % 79 %   Neutro Abs 11.5 (H) 1.4 - 6.5 K/uL   Lymphocytes Relative 15 %   Lymphs Abs 2.2 1.0 - 3.6 K/uL   Monocytes Relative 5 %   Monocytes Absolute 0.7 0.2 - 0.9 K/uL   Eosinophils Relative 1 %   Eosinophils Absolute 0.1 0 - 0.7 K/uL   Basophils Relative 0 %   Basophils Absolute 0.1 0 - 0.1 K/uL  Urinalysis complete, with microscopic     Status: Abnormal   Collection Time: 10/31/14  7:11 AM  Result Value Ref Range   Color, Urine AMBER (A) YELLOW   APPearance CLOUDY (A) CLEAR   Glucose, UA NEGATIVE NEGATIVE mg/dL   Bilirubin Urine NEGATIVE NEGATIVE   Ketones, ur NEGATIVE NEGATIVE mg/dL   Specific Gravity, Urine 1.031 (H) 1.005 - 1.030   Hgb urine dipstick NEGATIVE NEGATIVE   pH 5.0 5.0 - 8.0   Protein, ur 30 (A) NEGATIVE mg/dL   Nitrite NEGATIVE NEGATIVE   Leukocytes, UA 2+ (A) NEGATIVE   RBC / HPF 6-30 0 - 5 RBC/hpf   WBC, UA TOO NUMEROUS TO COUNT 0 - 5 WBC/hpf   Bacteria, UA FEW (A) NONE SEEN   Squamous Epithelial / LPF 6-30 (A) NONE SEEN   Mucous PRESENT    Hyaline Casts, UA PRESENT   Urine Drug Screen, Qualitative     Status: Abnormal   Collection Time: 10/31/14  7:11 AM  Result Value Ref Range   Tricyclic, Ur Screen NONE DETECTED NONE DETECTED   Amphetamines, Ur Screen NONE DETECTED NONE DETECTED   MDMA (Ecstasy)Ur Screen NONE DETECTED NONE DETECTED   Cocaine Metabolite,Ur [place] NONE DETECTED NONE DETECTED   Opiate, Ur Screen  POSITIVE (A) NONE DETECTED   Phencyclidine (PCP) Ur S NONE DETECTED NONE DETECTED   Cannabinoid 50 Ng, Ur [place] NONE DETECTED NONE DETECTED   Barbiturates, Ur Screen NONE DETECTED NONE DETECTED   Benzodiazepine, Ur Scrn POSITIVE (A) NONE DETECTED   Methadone Scn, Ur NONE DETECTED NONE DETECTED    Comment: (NOTE) 408  Tricyclics, urine               Cutoff 1000 ng/mL 200  Amphetamines, urine             Cutoff 1000 ng/mL 300  MDMA (Ecstasy), urine            Cutoff 500 ng/mL 400  Cocaine Metabolite, urine       Cutoff 300 ng/mL 500  Opiate, urine                   Cutoff 300 ng/mL 600  Phencyclidine (PCP), urine      Cutoff 25 ng/mL 700  Cannabinoid, urine              Cutoff 50 ng/mL 800  Barbiturates, urine             Cutoff 200 ng/mL 900  Benzodiazepine, urine           Cutoff 200 ng/mL 1000 Methadone, urine                Cutoff 300 ng/mL 1100 1200 The urine drug screen provides only a preliminary, unconfirmed 1300 analytical test result and should not be used for non-medical 1400 purposes. Clinical consideration and professional judgment should 1500 be applied to any positive drug screen result due to possible 1600 interfering substances. A more specific alternate chemical method 1700 must be used in order to obtain a confirmed analytical result.  1800 Gas chromato graphy / mass spectrometry (GC/MS) is the preferred 1900 confirmatory method.     Vitals: Blood pressure 108/62, pulse 61, temperature 98.4 F (36.9 C), temperature source Oral, resp. rate 16, height $RemoveBe'5\' 7"'jqWmiwzty$  (1.702 m), weight 99.791 kg (220 lb), last menstrual period 10/14/2014, SpO2 99 %.  Risk to Self: Suicidal Ideation: No Suicidal Intent: No Is patient at risk for suicide?: No Suicidal Plan?: No Access to Means: No What has been your use of drugs/alcohol within the last 12 months?: Pt denies  How many times?: 0 Other Self Harm Risks: None  Triggers for Past Attempts: None known Intentional Self Injurious Behavior: None Risk to Others: Homicidal Ideation: No Thoughts of Harm to Others: No Current Homicidal Intent: No Current Homicidal Plan: No Access to Homicidal Means: No Identified Victim: None  History of harm to others?: No Assessment of Violence: None Noted Violent Behavior Description: None  Does patient have access to weapons?: No Criminal Charges Pending?: No Does patient have a court date: No Prior Inpatient Therapy: Prior Inpatient  Therapy: No Prior Therapy Dates: None  Prior Therapy Facilty/Provider(s): None  Reason for Treatment: None  Prior Outpatient Therapy: Prior Outpatient Therapy: Yes Prior Therapy Dates: Current  Prior Therapy Facilty/Provider(s): "Do not want to discuss"  Reason for Treatment: Med Mgt  Does patient have an ACCT team?: No Does patient have Intensive In-House Services?  : No Does patient have [facility] services? : No Does patient have P[facility] services?: No  No current facility-administered medications for this encounter.   Current Outpatient Prescriptions  Medication Sig Dispense Refill  . amphetamine-dextroamphetamine (ADDERALL XR) 15 MG 24 hr capsule Take 15 mg by mouth 2 (two) times daily.    Marland Kitchen  ARIPiprazole (ABILIFY) 5 MG tablet Take 2.5-5 mg by mouth daily.    . clonazePAM (KLONOPIN) 1 MG tablet Take 1 mg by mouth 4 (four) times daily.    Marland Kitchen amphetamine-dextroamphetamine (ADDERALL) 30 MG tablet Take 30 mg by mouth 2 (two) times daily.     Marland Kitchen ibuprofen (ADVIL,MOTRIN) 800 MG tablet Take 1 tablet (800 mg total) by mouth every 8 (eight) hours as needed for mild pain or moderate pain. 15 tablet 0  . nitrofurantoin, macrocrystal-monohydrate, (MACROBID) 100 MG capsule Take 1 capsule (100 mg total) by mouth 2 (two) times daily. 14 capsule 0  . oxyCODONE-acetaminophen (ROXICET) 5-325 MG per tablet Take 1 tablet by mouth every 8 (eight) hours as needed for moderate pain or severe pain (Do not drive or operate heavy machinery while taking as can cause drowsiness.). 9 tablet 0  . sulfamethoxazole-trimethoprim (BACTRIM) 400-80 MG per tablet Take 2 tablets by mouth 2 (two) times daily. For 10 days (Patient not taking: Reported on 10/30/2014) 40 tablet 0    Musculoskeletal: Strength & Muscle Tone: within normal limits Gait & Station: normal Patient leans: N/A  Psychiatric Specialty Exam: Physical Exam  Nursing note and vitals reviewed. Constitutional: She appears well-developed and well-nourished.   HENT:  Head: Normocephalic and atraumatic.  Eyes: Conjunctivae are normal. Pupils are equal, Hickman, and reactive to light.  Neck: Normal range of motion.  Cardiovascular: Normal heart sounds.   Respiratory: Effort normal.  GI: Soft.  Musculoskeletal: Normal range of motion.  Neurological: She is alert.  Skin: Skin is warm and dry.  Psychiatric: Thought content normal. Her affect is blunt. Her speech is delayed. She is slowed and withdrawn. Cognition and memory are impaired. She expresses impulsivity. She exhibits abnormal recent memory.    Review of Systems  Constitutional: Negative.   HENT: Negative.   Eyes: Negative.   Respiratory: Negative.   Cardiovascular: Negative.   Gastrointestinal: Negative.   Musculoskeletal: Positive for back pain.  Skin: Negative.   Neurological: Negative.   Psychiatric/Behavioral: Positive for memory loss. Negative for depression, suicidal ideas, hallucinations and substance abuse. The patient is nervous/anxious. The patient does not have insomnia.     Blood pressure 108/62, pulse 61, temperature 98.4 F (36.9 C), temperature source Oral, resp. rate 16, height $RemoveBe'5\' 7"'QNXBWyWEk$  (1.702 m), weight 99.791 kg (220 lb), last menstrual period 10/14/2014, SpO2 99 %.Body mass index is 34.45 kg/(m^2).  General Appearance: Disheveled  Eye Sport and exercise psychologist::  None  Speech:  Slow  Volume:  Decreased  Mood:  Euthymic  Affect:  Flat  Thought Process:  Linear  Orientation:  Full (Time, Place, and Person)  Thought Content:  Negative  Suicidal Thoughts:  No  Homicidal Thoughts:  No  Memory:  Immediate;   Fair Recent;   Fair Remote;   Poor  Judgement:  Impaired  Insight:  Shallow  Psychomotor Activity:  Decreased  Concentration:  Poor  Recall:  [employer] of Knowledge:Fair  Language: Fair  Akathisia:  No  Handed:  Right  AIMS (if indicated):     Assets:  Communication Skills Housing Resilience Social Support  ADL's:  Intact  Cognition: WNL  Sleep:      Medical  Decision Making: New problem, with additional work up planned, Review of Psycho-Social Stressors (1), Review or order clinical lab tests (1) and Review of Medication Regimen & Side Effects (2)  Treatment Plan Summary: Plan Patient still looks pretty tired and was only partially cooperative with the interview but she did insist multiple times that  she was not suicidal which she has repeated throughout her time here in the hospital. She was alert and oriented and appeared to be at least able to have that much of a basic conversation. She is minimizing the rest of her symptoms and focusing only on her pain. There are several things about this that suggest to me that she may be abusing prescription drugs including as noted above the fact that she is positive for benzodiazepine's, the lack of acetaminophen in her blood, the negativity of the Adderall. Nevertheless at this point she is completely denying suicidal thoughts and is minimizing problems and does not appear to be an appropriate candidate for hospitalization. Patient has been educated and counseled about the dangers of opiate overdose. I am going to give her a prescription for a Narcan rescue kit. She will be referred back to Dr. Kasandra Knudsen for follow-up treatment. Case discussed with emergency room doctor. Involuntary commitment canceled.  Plan:  Patient does not meet criteria for psychiatric inpatient admission. Supportive therapy provided about ongoing stressors. Discussed crisis plan, support from social network, calling 911, coming to the Emergency Department, and calling Suicide Hotline. Disposition: Discharge at the discretion of emergency room  Alroy Portela 10/31/2014 11:38 AM

## 2014-10-31 NOTE — ED Notes (Addendum)
BEHAVIORAL HEALTH ROUNDING Patient sleeping: YES Patient alert and oriented: Sleeping Behavior appropriate: Sleeping Describe behavior: No inappropriate or unacceptable behaviors noted at this time.  Nutrition and fluids offered: Sleeping Toileting and hygiene offered: Sleeping Sitter present: Behavioral tech rounding every 15 minutes on patient to ensure safety.  Law enforcement present: Loss adjuster, chartered agency: [employer] ([employer]  Patient with NAD noted; even and non-labored. Patient easy to arouse to verbal stimulation. Patient with no verbalized needs. Will continue to monitor.

## 2014-10-31 NOTE — ED Notes (Signed)
BEHAVIORAL HEALTH ROUNDING Patient sleeping: Yes.   Patient alert and oriented: not applicable Behavior appropriate: Yes.  ; If no, describe:  Nutrition and fluids offered: No Toileting and hygiene offered: No Sitter present: no Law enforcement present: Yes  

## 2014-10-31 NOTE — [facility] Counselor (Signed)
Late Note---Writer dicussed with patient about following up with he current Mental Health Outpatient Provider, Dr. Janeece Riggers, with [facility].

## 2014-10-31 NOTE — ED Notes (Signed)

## 2014-10-31 NOTE — ED Notes (Signed)
Pt changed into behavioral med clothing; pt angry and cursing and stating she did not need to be here. Pt refused to remove belly ring or lip stud.

## 2014-10-31 NOTE — ED Notes (Signed)
Pt discharge papers ready. Husband is looking for ride to come and pick pt up. Pt eating lunch and resting comfortably.

## 2014-10-31 NOTE — ED Notes (Signed)
Pt given phone to speak with her husband.  

## 2014-10-31 NOTE — ED Notes (Signed)
Patient continues to rest in room with NAD noted. No behaviors this shift. RN anticipates no needs at this time. Will continue to monitor.  

## 2015-01-13 ENCOUNTER — Encounter: Payer: Self-pay | Admitting: Family Medicine

## 2015-01-13 ENCOUNTER — Ambulatory Visit (INDEPENDENT_AMBULATORY_CARE_PROVIDER_SITE_OTHER): Payer: Medicaid Other | Admitting: Family Medicine

## 2015-01-13 VITALS — BP 118/79 | HR 80 | Resp 16 | Ht 67.0 in | Wt 284.0 lb

## 2015-01-13 DIAGNOSIS — M545 Low back pain, unspecified: Secondary | ICD-10-CM | POA: Insufficient documentation

## 2015-01-13 DIAGNOSIS — M79604 Pain in right leg: Secondary | ICD-10-CM

## 2015-01-13 DIAGNOSIS — M5442 Lumbago with sciatica, left side: Secondary | ICD-10-CM | POA: Diagnosis not present

## 2015-01-13 DIAGNOSIS — M5441 Lumbago with sciatica, right side: Secondary | ICD-10-CM

## 2015-01-13 MED ORDER — BACLOFEN 10 MG PO TABS: 10.0000 mg | ORAL_TABLET | Freq: Three times a day (TID) | ORAL | Status: DC

## 2015-01-13 MED ORDER — ETODOLAC 500 MG PO TABS: 500.0000 mg | ORAL_TABLET | Freq: Two times a day (BID) | ORAL | Status: DC
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
## 2015-01-22 ENCOUNTER — Telehealth: Payer: Self-pay | Admitting: *Deleted

## 2015-01-22 NOTE — Telephone Encounter (Signed)
Received fax from [facility], they have been trying to contact patient to schedule f/u. Patient is already an established patient there and seen 04/2014.

## 2015-05-05 ENCOUNTER — Emergency Department: Payer: Medicaid Other

## 2015-05-05 ENCOUNTER — Encounter: Payer: Self-pay | Admitting: *Deleted

## 2015-05-05 ENCOUNTER — Emergency Department
Admission: EM | Admit: 2015-05-05 | Discharge: 2015-05-05 | Disposition: A | Discharge status: Home / Self Care | Payer: Medicaid Other | Attending: Emergency Medicine | Admitting: Emergency Medicine

## 2015-05-05 DIAGNOSIS — N12 Tubulo-interstitial nephritis, not specified as acute or chronic: Secondary | ICD-10-CM | POA: Diagnosis not present

## 2015-05-05 DIAGNOSIS — Z791 Long term (current) use of non-steroidal anti-inflammatories (NSAID): Secondary | ICD-10-CM | POA: Diagnosis not present

## 2015-05-05 DIAGNOSIS — Z3202 Encounter for pregnancy test, result negative: Secondary | ICD-10-CM | POA: Insufficient documentation

## 2015-05-05 DIAGNOSIS — R109 Unspecified abdominal pain: Secondary | ICD-10-CM

## 2015-05-05 DIAGNOSIS — Z880 Allergy status to penicillin: Secondary | ICD-10-CM | POA: Diagnosis not present

## 2015-05-05 DIAGNOSIS — F17200 Nicotine dependence, unspecified, uncomplicated: Secondary | ICD-10-CM | POA: Diagnosis not present

## 2015-05-05 LAB — URINALYSIS COMPLETE WITH MICROSCOPIC (ARMC ONLY)
BACTERIA UA: NONE SEEN
Bilirubin Urine: NEGATIVE
Glucose, UA: NEGATIVE mg/dL
HGB URINE DIPSTICK: NEGATIVE
Ketones, ur: NEGATIVE mg/dL
Nitrite: NEGATIVE
PH: 6 (ref 5.0–8.0)
PROTEIN: NEGATIVE mg/dL
Specific Gravity, Urine: 1.017 (ref 1.005–1.030)

## 2015-05-05 LAB — POCT PREGNANCY, URINE: PREG TEST UR: NEGATIVE

## 2015-05-05 MED ORDER — CIPROFLOXACIN HCL 500 MG PO TABS: 500.0000 mg | ORAL_TABLET | Freq: Two times a day (BID) | ORAL | Status: DC

## 2015-05-05 MED ORDER — CIPROFLOXACIN HCL 500 MG PO TABS
500.0000 mg | ORAL_TABLET | Freq: Once | ORAL | Status: AC
  Administered 2015-05-05: 500 mg via ORAL
  Filled 2015-05-05: qty 1

## 2015-05-05 NOTE — ED Notes (Signed)
Pt reports bilateral flank pain for the last 3 months increasing today, pt reports blood in urine at times, denies fever

## 2015-05-05 NOTE — Discharge Instructions (Signed)
Pyelonephritis, Adult °Pyelonephritis is a kidney infection. The kidneys are organs that help clean your blood by moving waste out of your blood and into your pee (urine). This infection can happen quickly, or it can last for a long time. In most cases, it clears up with treatment and does not cause other problems. °HOME CARE °Medicines °· Take over-the-counter and prescription medicines only as told by your doctor. °· Take your antibiotic medicine as told by your doctor. Do not stop taking the medicine even if you start to feel better. °General Instructions °· Drink enough fluid to keep your pee clear or pale yellow. °· Avoid caffeine, tea, and carbonated drinks. °· Pee (urinate) often. Avoid holding in pee for long periods of time. °· Pee before and after sex. °· After pooping (having a bowel movement), women should wipe from front to back. Use each tissue only once. °· Keep all follow-up visits as told by your doctor. This is important. °GET HELP IF: °· You do not feel better after 2 days. °· Your symptoms get worse. °· You have a fever. °GET HELP RIGHT AWAY IF: °· You cannot take your medicine or drink fluids as told. °· You have chills and shaking. °· You throw up (vomit). °· You have very bad pain in your side (flank) or back. °· You feel very weak or you pass out (faint). °  °This information is not intended to replace advice given to you by your health care provider. Make sure you discuss any questions you have with your health care provider. °  °Document Released: 03/18/2004 Document Revised: 10/30/2014 Document Reviewed: 06/03/2014 °Elsevier Interactive Patient Education ©2016 [employer] ° °

## 2015-05-05 NOTE — ED Provider Notes (Signed)
[facility] Emergency Department Provider Note  ____________________________________________  Time seen: Approximately 550 PM  I have reviewed the triage vital signs and the nursing notes.   HISTORY  Chief Complaint Flank Pain    HPI Tammy Hickman is a 25 y.o. female with a history of anxiety and bipolar disorder who is presenting to the emergency department for bilateral flank pain which is worse than right. She describes the pain as a cramping. She says that she is also had intermittent blood in her urine. Says that she was at [facility] several weeks ago for evaluation was found to have a "severe kidney infection." However, the patient left before is able to be evaluated or given any antibiotics. She said that the symptoms resolved and now they are returning. This is the reason she is coming back to the emergency department today. Denies any fevers. Does have a family history of kidney stones.   Past Medical History  Diagnosis Date  . Mental disorders of mother, antepartum   . Obesity complicating pregnancy, childbirth, or the puerperium, antepartum condition or complication(649.13)   . Other specified indication for care or intervention related to labor and delivery, antepartum   . Anxiety   . Asthma   . Thyroid dysfunction   . Hernia, umbilical 2011  . Bipolar disorder [facility](HCC)     Patient Active Problem List   Diagnosis Date Noted  . Low back pain 01/13/2015  . Leg pain, right 01/13/2015  . Opiate overdose 10/31/2014  . Dysthymia 10/31/2014  . ADHD (attention deficit hyperactivity disorder) 10/31/2014  . Obesity 07/27/2011  . Contraception, generic surveillance 07/27/2011    Past Surgical History  Procedure Laterality Date  . Tonsillectomy  2009  . Hernia repair      umbilical hernia    Current Outpatient Rx  Name  Route  Sig  Dispense  Refill  . baclofen (LIORESAL) 10 MG tablet   Oral   Take 1 tablet (10 mg total) by  mouth 3 (three) times daily.   60 each   2   . etodolac (LODINE) 500 MG tablet   Oral   Take 1 tablet (500 mg total) by mouth 2 (two) times daily.   60 tablet   3   . ibuprofen (ADVIL,MOTRIN) 800 MG tablet   Oral   Take 1,600 mg by mouth every 8 (eight) hours as needed.           Allergies Fentanyl; Meloxicam; Penicillins; Prednisone; and Hydromorphone  Family History  Problem Relation Age of Onset  . Cancer Paternal Grandfather     Esophageal  . Heart disease Paternal Grandmother   . Diabetes Maternal Grandmother   . Heart disease Maternal Grandfather   . Diabetes Maternal Grandfather   . Cancer Maternal Grandfather     Esophageal  . Heart disease Maternal Aunt     Social History Social History  Substance Use Topics  . Smoking status: Light Tobacco Smoker -- 0.20 packs/day    Last Attempt to Quit: 11/14/2009  . Smokeless tobacco: Never Used  . Alcohol Use: No    Review of Systems Constitutional: No fever/chills Eyes: No visual changes. ENT: No sore throat. Cardiovascular: Denies chest pain. Respiratory: Denies shortness of breath. Gastrointestinal: No abdominal pain.  No nausea, no vomiting.  No diarrhea.  No constipation. Genitourinary: Negative for dysuria. Musculoskeletal: As above Skin: Negative for rash. Neurological: Negative for headaches, focal weakness or numbness.  10-point ROS otherwise negative.  ____________________________________________   PHYSICAL  EXAM:  VITAL SIGNS: ED Triage Vitals  Enc Vitals Group     BP 05/05/15 1618 129/82 mmHg     Pulse Rate 05/05/15 1618 77     Resp 05/05/15 1618 20     Temp 05/05/15 1618 98.3 F (36.8 C)     Temp Source 05/05/15 1618 Oral     SpO2 05/05/15 1618 98 %     Weight 05/05/15 1618 250 lb (113.399 kg)     Height 05/05/15 1618  (1.727 m)     Head Cir --      Peak Flow --      Pain Score 05/05/15 1619 8     Pain Loc --      Pain Edu? --      Excl. in GC? --     Constitutional:  Alert and oriented. Well appearing and in no acute distress. Eyes: Conjunctivae are normal. PERRL. EOMI. Head: Atraumatic. Nose: No congestion/rhinnorhea. Mouth/Throat: Mucous membranes are moist.   Neck: No stridor.   Cardiovascular: Normal rate, regular rhythm. Grossly normal heart sounds.  Good peripheral circulation. Respiratory: Normal respiratory effort.  No retractions. Lungs CTAB. Gastrointestinal: Soft and nontender. No distention. No abdominal bruits. Mild right-sided CVA tenderness to palpation. Musculoskeletal: No lower extremity tenderness nor edema.  No joint effusions. Neurologic:  Normal speech and language. No gross focal neurologic deficits are appreciated. No gait instability. Skin:  Skin is warm, dry and intact. No rash noted. Psychiatric: Mood and affect are normal. Speech and behavior are normal.  ____________________________________________   LABS (all labs ordered are listed, but only abnormal results are displayed)  Labs Reviewed  URINALYSIS COMPLETEWITH MICROSCOPIC ([facility] ONLY) - Abnormal; Notable for the following:    Color, Urine YELLOW (*)    APPearance CLEAR (*)    Leukocytes, UA TRACE (*)    Squamous Epithelial / LPF 0-5 (*)    All other components within normal limits  POC URINE PREG, ED  POCT PREGNANCY, URINE   ____________________________________________  EKG   ____________________________________________  RADIOLOGY   IMPRESSION: 1. Kidneys and bladder appear normal. No renal or ureteral calculi. No perinephric fluid. 2. Hyperdense material filling the stomach, and scattered within the proximal small bowel, presumably recently ingested, possibly numerous pills? Recommend clinical correlation. 3. Remainder of the abdomen and pelvis is unremarkable, as detailed above.   Electronically Signed By: Bary Richard M.D. On: 05/05/2015  18:40 ____________________________________________   PROCEDURES    ____________________________________________   INITIAL IMPRESSION / ASSESSMENT AND PLAN / ED COURSE  Pertinent labs & imaging results that were available during my care of the patient were reviewed by me and considered in my medical decision making (see chart for details).  ----------------------------------------- 7:31 PM on 05/05/2015 -----------------------------------------  Patient resting comfortable at this time. She denied taking any large amount of pills prior to arrival. She says that she did eat just prior to arrival and this is likely the reason for the findings on her CAT scan. I will discharge with antibiotics. Does have trace leukocytes but with clinical findings suggestive of UTI and says that she had previous findings with UTI which was untreated at [facility]. Discussed the plan as well as the lab results with the patient and she is willing to comply. ____________________________________________   FINAL CLINICAL IMPRESSION(S) / ED DIAGNOSES  Final diagnoses:  Bilateral flank pain   pyelonephritis.    Myrna Blazer, MD 05/05/15 [phone]

## 2015-05-13 ENCOUNTER — Emergency Department
Admission: EM | Admit: 2015-05-13 | Discharge: 2015-05-13 | Disposition: A | Discharge status: Home / Self Care | Payer: Medicaid Other | Attending: Emergency Medicine | Admitting: Emergency Medicine

## 2015-05-13 ENCOUNTER — Encounter: Payer: Self-pay | Admitting: Emergency Medicine

## 2015-05-13 ENCOUNTER — Emergency Department: Payer: Medicaid Other

## 2015-05-13 DIAGNOSIS — Y9301 Activity, walking, marching and hiking: Secondary | ICD-10-CM | POA: Diagnosis not present

## 2015-05-13 DIAGNOSIS — Y92415 Exit ramp or entrance ramp of street or highway as the place of occurrence of the external cause: Secondary | ICD-10-CM | POA: Diagnosis not present

## 2015-05-13 DIAGNOSIS — S3992XA Unspecified injury of lower back, initial encounter: Secondary | ICD-10-CM | POA: Diagnosis not present

## 2015-05-13 DIAGNOSIS — S8991XA Unspecified injury of right lower leg, initial encounter: Secondary | ICD-10-CM | POA: Diagnosis present

## 2015-05-13 DIAGNOSIS — S79911A Unspecified injury of right hip, initial encounter: Secondary | ICD-10-CM | POA: Insufficient documentation

## 2015-05-13 DIAGNOSIS — Z880 Allergy status to penicillin: Secondary | ICD-10-CM | POA: Diagnosis not present

## 2015-05-13 DIAGNOSIS — W010XXA Fall on same level from slipping, tripping and stumbling without subsequent striking against object, initial encounter: Secondary | ICD-10-CM | POA: Insufficient documentation

## 2015-05-13 DIAGNOSIS — F17200 Nicotine dependence, unspecified, uncomplicated: Secondary | ICD-10-CM | POA: Diagnosis not present

## 2015-05-13 DIAGNOSIS — Y998 Other external cause status: Secondary | ICD-10-CM | POA: Diagnosis not present

## 2015-05-13 DIAGNOSIS — S8261XA Displaced fracture of lateral malleolus of right fibula, initial encounter for closed fracture: Secondary | ICD-10-CM

## 2015-05-13 MED ORDER — CYCLOBENZAPRINE HCL 10 MG PO TABS
10.0000 mg | ORAL_TABLET | Freq: Once | ORAL | Status: AC
  Administered 2015-05-13: 10 mg via ORAL
  Filled 2015-05-13: qty 1

## 2015-05-13 MED ORDER — OXYCODONE-ACETAMINOPHEN 5-325 MG PO TABS: 1.0000 | ORAL_TABLET | ORAL | Status: DC | PRN

## 2015-05-13 MED ORDER — HYDROCODONE-ACETAMINOPHEN 5-325 MG PO TABS
2.0000 | ORAL_TABLET | Freq: Once | ORAL | Status: AC
  Administered 2015-05-13: 2 via ORAL
  Filled 2015-05-13: qty 2

## 2015-05-13 MED ORDER — IBUPROFEN 800 MG PO TABS: 800.0000 mg | ORAL_TABLET | Freq: Three times a day (TID) | ORAL | Status: DC | PRN

## 2015-05-13 NOTE — ED Notes (Signed)
Patient presents to the ED with right knee, ankle and back pain after tripping and falling walking down a ramp yesterday evening.  Patient is in no obvious distress at this time.  Patient is able to bear weight on right leg.  Sensation and mobility is intact.

## 2015-05-13 NOTE — ED Notes (Signed)
States she fell yesterday  States she bent her right knee backwards and twisted right ankle  Increased pain with standing

## 2015-05-13 NOTE — ED Provider Notes (Signed)
[facility] Emergency Department Provider Note  ____________________________________________  Time seen: Approximately 3:20 PM  I have reviewed the triage vital signs and the nursing notes.   HISTORY  Chief Complaint Fall    HPI Tammy Hickman is a 25 y.o. female who presents for evaluation of fall yesterday. Patient reports that she bent her right knee backwards and twisted her right ankle. In addition complains of lower back and hip pain. Denies any loss of consciousness. States pain is worse when trying to walk however she does walk without crutches.   Past Medical History  Diagnosis Date  . Mental disorders of mother, antepartum   . Obesity complicating pregnancy, childbirth, or the puerperium, antepartum condition or complication(649.13)   . Other specified indication for care or intervention related to labor and delivery, antepartum   . Anxiety   . Asthma   . Thyroid dysfunction   . Hernia, umbilical 2011  . Bipolar disorder [facility](HCC)     Patient Active Problem List   Diagnosis Date Noted  . Low back pain 01/13/2015  . Leg pain, right 01/13/2015  . Opiate overdose 10/31/2014  . Dysthymia 10/31/2014  . ADHD (attention deficit hyperactivity disorder) 10/31/2014  . Obesity 07/27/2011  . Contraception, generic surveillance 07/27/2011    Past Surgical History  Procedure Laterality Date  . Tonsillectomy  2009  . Hernia repair      umbilical hernia  . Abdominal surgery      Current Outpatient Rx  Name  Route  Sig  Dispense  Refill  . ibuprofen (ADVIL,MOTRIN) 800 MG tablet   Oral   Take 1 tablet (800 mg total) by mouth every 8 (eight) hours as needed.   30 tablet   0   . oxyCODONE-acetaminophen (ROXICET) 5-325 MG tablet   Oral   Take 1-2 tablets by mouth every 4 (four) hours as needed for severe pain.   15 tablet   0     Allergies Fentanyl; Meloxicam; Penicillins; Prednisone; and Hydromorphone  Family History  Problem  Relation Age of Onset  . Cancer Paternal Grandfather     Esophageal  . Heart disease Paternal Grandmother   . Diabetes Maternal Grandmother   . Heart disease Maternal Grandfather   . Diabetes Maternal Grandfather   . Cancer Maternal Grandfather     Esophageal  . Heart disease Maternal Aunt     Social History Social History  Substance Use Topics  . Smoking status: Light Tobacco Smoker -- 0.20 packs/day    Last Attempt to Quit: 11/14/2009  . Smokeless tobacco: Never Used  . Alcohol Use: No    Review of Systems Constitutional: No fever/chills Gastrointestinal: No abdominal pain.  No nausea, no vomiting.  No diarrhea.  No constipation. Genitourinary: Negative for dysuria. Musculoskeletal: Positive for low back pain, positive for right hip knee and ankle pain. Skin: Negative for rash. Neurological: Negative for headaches, focal weakness or numbness.  10-point ROS otherwise negative.  ____________________________________________   PHYSICAL EXAM:  VITAL SIGNS: ED Triage Vitals  Enc Vitals Group     BP 05/13/15 1323 112/90 mmHg     Pulse Rate 05/13/15 1323 92     Resp 05/13/15 1323 18     Temp 05/13/15 1323 98.4 F (36.9 C)     Temp Source 05/13/15 1323 Oral     SpO2 05/13/15 1323 98 %     Weight 05/13/15 1323 250 lb (113.399 kg)     Height 05/13/15 1323 5\' 8"  (1.727 m)  Head Cir --      Peak Flow --      Pain Score 05/13/15 1323 9     Pain Loc --      Pain Edu? --      Excl. in GC? --     Constitutional: Alert and oriented. Well appearing and in no acute distress.  Cardiovascular: Normal rate, regular rhythm. Grossly normal heart sounds.  Good peripheral circulation. Respiratory: Normal respiratory effort.  No retractions. Lungs CTAB. Musculoskeletal: No lower extremity tenderness nor edema.  No joint effusions. Neurologic:  Normal speech and language. No gross focal neurologic deficits are appreciated. Gait not tested secondary to pain. Skin:  Skin is warm,  dry and intact. No rash noted. Psychiatric: Mood and affect are normal. Speech and behavior are normal.  ____________________________________________   LABS (all labs ordered are listed, but only abnormal results are displayed)  Labs Reviewed - No data to display  RADIOLOGY  Avulsion fracture right medial malleolus. Right knee with tricompartmental arthritis noted ____________________________________________   PROCEDURES  Procedure(s) performed: None  Critical Care performed: No  ____________________________________________   INITIAL IMPRESSION / ASSESSMENT AND PLAN / ED COURSE  Pertinent labs & imaging results that were available during my care of the patient were reviewed by me and considered in my medical decision making (see chart for details).  Acute right malleolus avulsion fracture. Rx given for Percocet 5/325, Motrin 800, and she is to follow-up with orthopedics this week. Patient was given an ankle stirrup splint and crutches provided. She voices no other emergency medical complaints at this time. ____________________________________________   FINAL CLINICAL IMPRESSION(S) / ED DIAGNOSES  Final diagnoses:  Closed low lateral malleolus fracture, right, initial encounter     This chart was dictated using voice recognition software/Dragon. Despite best efforts to proofread, errors can occur which can change the meaning. Any change was purely unintentional.   Evangeline Dakin, PA-C 05/13/15 1607  Myrna Blazer, MD 05/13/15 [phone]

## 2015-05-13 NOTE — ED Notes (Signed)
Scanner broken in rm 47.

## 2015-05-13 NOTE — Discharge Instructions (Signed)
Ankle Fracture A fracture is a break in a bone. The ankle joint is made up of three bones. These include the lower (distal)sections of your lower leg bones, called the tibia and fibula, along with a bone in your foot, called the talus. Depending on how bad the break is and if more than one ankle joint bone is broken, a cast or splint is used to protect and keep your injured bone from moving while it heals. Sometimes, surgery is required to help the fracture heal properly.  There are two general types of fractures:  Stable fracture. This includes a single fracture line through one bone, with no injury to ankle ligaments. A fracture of the talus that does not have any displacement (movement of the bone on either side of the fracture line) is also stable.  Unstable fracture. This includes more than one fracture line through one or more bones in the ankle joint. It also includes fractures that have displacement of the bone on either side of the fracture line. CAUSES  A direct blow to the ankle.   Quickly and severely twisting your ankle.  Trauma, such as a car accident or falling from a significant height. RISK FACTORS You may be at a higher risk of ankle fracture if:  You have certain medical conditions.  You are involved in high-impact sports.  You are involved in a high-impact car accident. SIGNS AND SYMPTOMS   Tender and swollen ankle.  Bruising around the injured ankle.  Pain on movement of the ankle.  Difficulty walking or putting weight on the ankle.  A cold foot below the site of the ankle injury. This can occur if the blood vessels passing through your injured ankle were also damaged.  Numbness in the foot below the site of the ankle injury. DIAGNOSIS  An ankle fracture is usually diagnosed with a physical exam and X-rays. A CT scan may also be required for complex fractures. TREATMENT  Stable fractures are treated with a cast or splint and using crutches to avoid putting  weight on your injured ankle. This is followed by an ankle strengthening program. Some patients require a special type of cast, depending on other medical problems they may have. Unstable fractures require surgery to ensure the bones heal properly. Your health care provider will tell you what type of fracture you have and the best treatment for your condition. HOME CARE INSTRUCTIONS   Review correct crutch use with your health care provider and use your crutches as directed. Safe use of crutches is extremely important. Misuse of crutches can cause you to fall or cause injury to nerves in your hands or armpits.  Do not put weight or pressure on the injured ankle until directed by your health care provider.  To lessen the swelling, keep the injured leg elevated while sitting or lying down.  Apply ice to the injured area:  Put ice in a plastic bag.  Place a towel between your cast and the bag.  Leave the ice on for 20 minutes, 2-3 times a day.  If you have a plaster or fiberglass cast:  Do not try to scratch the skin under the cast with any objects. This can increase your risk of skin infection.  Check the skin around the cast every day. You may put lotion on any red or sore areas.  Keep your cast dry and clean.  If you have a plaster splint:  Wear the splint as directed.  You may loosen the elastic   around the splint if your toes become numb, tingle, or turn cold or blue.  Do not put pressure on any part of your cast or splint; it may break. Rest your cast only on a pillow the first 24 hours until it is fully hardened.  Your cast or splint can be protected during bathing with a plastic bag sealed to your skin with medical tape. Do not lower the cast or splint into water.  Take medicines as directed by your health care provider. Only take over-the-counter or prescription medicines for pain, discomfort, or fever as directed by your health care provider.  Do not drive a vehicle until  your health care provider specifically tells you it is safe to do so.  If your health care provider has given you a follow-up appointment, it is very important to keep that appointment. Not keeping the appointment could result in a chronic or permanent injury, pain, and disability. If you have any problem keeping the appointment, call the facility for assistance. SEEK MEDICAL CARE IF: You develop increased swelling or discomfort. SEEK IMMEDIATE MEDICAL CARE IF:   Your cast gets damaged or breaks.  You have continued severe pain.  You develop new pain or swelling after the cast was put on.  Your skin or toenails below the injury turn blue or gray.  Your skin or toenails below the injury feel cold, numb, or have loss of sensitivity to touch.  There is a bad smell or pus draining from under the cast. MAKE SURE YOU:   Understand these instructions.  Will watch your condition.  Will get help right away if you are not doing well or get worse.   This information is not intended to replace advice given to you by your health care provider. Make sure you discuss any questions you have with your health care provider.   Document Released: 02/06/2000 Document Revised: 02/13/2013 Document Reviewed: 09/07/2012 Elsevier Interactive Patient Education 2016 [employer]  

## 2015-09-25 ENCOUNTER — Encounter: Payer: Self-pay | Admitting: Emergency Medicine

## 2015-09-25 ENCOUNTER — Emergency Department
Admission: EM | Admit: 2015-09-25 | Discharge: 2015-09-25 | Disposition: A | Discharge status: Home / Self Care | Payer: Medicaid Other | Attending: Student in an Organized Health Care Education/Training Program | Admitting: Student in an Organized Health Care Education/Training Program

## 2015-09-25 DIAGNOSIS — N10 Acute tubulo-interstitial nephritis: Secondary | ICD-10-CM | POA: Insufficient documentation

## 2015-09-25 DIAGNOSIS — L509 Urticaria, unspecified: Secondary | ICD-10-CM

## 2015-09-25 DIAGNOSIS — F17200 Nicotine dependence, unspecified, uncomplicated: Secondary | ICD-10-CM | POA: Insufficient documentation

## 2015-09-25 DIAGNOSIS — T7840XA Allergy, unspecified, initial encounter: Secondary | ICD-10-CM

## 2015-09-25 DIAGNOSIS — L500 Allergic urticaria: Secondary | ICD-10-CM | POA: Insufficient documentation

## 2015-09-25 DIAGNOSIS — R109 Unspecified abdominal pain: Secondary | ICD-10-CM

## 2015-09-25 DIAGNOSIS — E876 Hypokalemia: Secondary | ICD-10-CM | POA: Insufficient documentation

## 2015-09-25 DIAGNOSIS — J45909 Unspecified asthma, uncomplicated: Secondary | ICD-10-CM | POA: Insufficient documentation

## 2015-09-25 LAB — URINALYSIS COMPLETE WITH MICROSCOPIC (ARMC ONLY)
BILIRUBIN URINE: NEGATIVE
GLUCOSE, UA: NEGATIVE mg/dL
KETONES UR: NEGATIVE mg/dL
NITRITE: POSITIVE — AB
PH: 6 (ref 5.0–8.0)
Protein, ur: 100 mg/dL — AB
Specific Gravity, Urine: 1.019 (ref 1.005–1.030)

## 2015-09-25 LAB — COMPREHENSIVE METABOLIC PANEL
ALK PHOS: 47 U/L (ref 38–126)
ALT: 15 U/L (ref 14–54)
ANION GAP: 10 (ref 5–15)
AST: 16 U/L (ref 15–41)
Albumin: 3.6 g/dL (ref 3.5–5.0)
BUN: 14 mg/dL (ref 6–20)
CALCIUM: 8.7 mg/dL — AB (ref 8.9–10.3)
CO2: 23 mmol/L (ref 22–32)
CREATININE: 0.83 mg/dL (ref 0.44–1.00)
Chloride: 100 mmol/L — ABNORMAL LOW (ref 101–111)
Glucose, Bld: 109 mg/dL — ABNORMAL HIGH (ref 65–99)
Potassium: 3.1 mmol/L — ABNORMAL LOW (ref 3.5–5.1)
Sodium: 133 mmol/L — ABNORMAL LOW (ref 135–145)
Total Bilirubin: 1.2 mg/dL (ref 0.3–1.2)
Total Protein: 7.3 g/dL (ref 6.5–8.1)

## 2015-09-25 LAB — POCT PREGNANCY, URINE: Preg Test, Ur: NEGATIVE

## 2015-09-25 LAB — CBC
HCT: 45.5 % (ref 35.0–47.0)
HEMOGLOBIN: 15 g/dL (ref 12.0–16.0)
MCH: 29.6 pg (ref 26.0–34.0)
MCHC: 32.9 g/dL (ref 32.0–36.0)
MCV: 90.1 fL (ref 80.0–100.0)
PLATELETS: 261 10*3/uL (ref 150–440)
RBC: 5.05 MIL/uL (ref 3.80–5.20)
RDW: 14.4 % (ref 11.5–14.5)
WBC: 13.8 10*3/uL — ABNORMAL HIGH (ref 3.6–11.0)

## 2015-09-25 MED ORDER — DEXAMETHASONE 4 MG PO TABS: 4.0000 mg | ORAL_TABLET | Freq: Every day | ORAL | 0 refills | Status: AC

## 2015-09-25 MED ORDER — CEPHALEXIN 500 MG PO CAPS
500.0000 mg | ORAL_CAPSULE | Freq: Once | ORAL | Status: AC
  Administered 2015-09-25: 500 mg via ORAL
  Filled 2015-09-25: qty 1

## 2015-09-25 MED ORDER — FAMOTIDINE IN NACL 20-0.9 MG/50ML-% IV SOLN
20.0000 mg | Freq: Once | INTRAVENOUS | Status: AC
  Administered 2015-09-25: 20 mg via INTRAVENOUS

## 2015-09-25 MED ORDER — METHYLPREDNISOLONE SODIUM SUCC 125 MG IJ SOLR
125.0000 mg | Freq: Once | INTRAMUSCULAR | Status: AC
  Administered 2015-09-25: 125 mg via INTRAVENOUS

## 2015-09-25 MED ORDER — SODIUM CHLORIDE 0.9 % IV BOLUS (SEPSIS)
500.0000 mL | Freq: Once | INTRAVENOUS | Status: AC
  Administered 2015-09-25: 500 mL via INTRAVENOUS

## 2015-09-25 MED ORDER — METHYLPREDNISOLONE SODIUM SUCC 125 MG IJ SOLR
INTRAMUSCULAR | Status: AC
  Administered 2015-09-25: 125 mg via INTRAVENOUS
  Filled 2015-09-25: qty 2

## 2015-09-25 MED ORDER — FAMOTIDINE IN NACL 20-0.9 MG/50ML-% IV SOLN
INTRAVENOUS | Status: AC
  Administered 2015-09-25: 20 mg via INTRAVENOUS
  Filled 2015-09-25: qty 50

## 2015-09-25 MED ORDER — SODIUM CHLORIDE 0.9 % IV BOLUS (SEPSIS)
1000.0000 mL | Freq: Once | INTRAVENOUS | Status: AC
  Administered 2015-09-25: 1000 mL via INTRAVENOUS

## 2015-09-25 MED ORDER — PREDNISONE 20 MG PO TABS: 40.0000 mg | ORAL_TABLET | Freq: Every day | ORAL | 0 refills | Status: AC

## 2015-09-25 MED ORDER — EPINEPHRINE 0.3 MG/0.3ML IJ SOAJ: 0.3000 mg | Freq: Once | INTRAMUSCULAR | 0 refills | Status: AC

## 2015-09-25 MED ORDER — DIPHENHYDRAMINE HCL 50 MG/ML IJ SOLN
50.0000 mg | Freq: Once | INTRAMUSCULAR | Status: AC
  Administered 2015-09-25: 50 mg via INTRAVENOUS

## 2015-09-25 MED ORDER — DIPHENHYDRAMINE HCL 50 MG/ML IJ SOLN
INTRAMUSCULAR | Status: AC
  Administered 2015-09-25: 50 mg via INTRAVENOUS
  Filled 2015-09-25: qty 1

## 2015-09-25 MED ORDER — POTASSIUM CHLORIDE CRYS ER 20 MEQ PO TBCR
40.0000 meq | EXTENDED_RELEASE_TABLET | Freq: Once | ORAL | Status: AC
  Administered 2015-09-25: 40 meq via ORAL
  Filled 2015-09-25: qty 2

## 2015-09-25 MED ORDER — CEPHALEXIN 500 MG PO CAPS: 500.0000 mg | ORAL_CAPSULE | Freq: Four times a day (QID) | ORAL | 0 refills | Status: AC

## 2015-09-25 NOTE — ED Provider Notes (Signed)
[facility] Emergency Department Provider Note    First MD Initiated Contact with Patient 09/25/15 [phone]     (approximate)  I have reviewed the triage vital signs and the nursing notes.   HISTORY  Chief Complaint Allergic Reaction    HPI Tammy Hickman is a 25 y.o. female who presents with diffuse urticaria that started last night. Patient is uncertain of any inciting factors or recent exposures. Does state that she's had some dysuria over the past several days and right flank pain.  Denies any abdominal pain nausea or vomiting. Denies any shortness of breath or chest pain at this time. Does not have any difficulty swallowing. States that the rash is predominantly on her chest and back. She does not have a history of significant anaphylactic or anaphylactoid reactions.     Past Medical History:  Diagnosis Date  . Anxiety   . Asthma   . Bipolar disorder (HCC)   . Hernia, umbilical 2011  . Mental disorders of mother, antepartum   . Obesity complicating pregnancy, childbirth, or the puerperium, antepartum condition or complication(649.13)   . Other specified indication for care or intervention related to labor and delivery, antepartum   . Thyroid dysfunction     Patient Active Problem List   Diagnosis Date Noted  . Low back pain 01/13/2015  . Leg pain, right 01/13/2015  . Opiate overdose 10/31/2014  . Dysthymia 10/31/2014  . ADHD (attention deficit hyperactivity disorder) 10/31/2014  . Obesity 07/27/2011  . Contraception, generic surveillance 07/27/2011    Past Surgical History:  Procedure Laterality Date  . ABDOMINAL SURGERY    . HERNIA REPAIR     umbilical hernia  . TONSILLECTOMY  2009    Prior to Admission medications   Medication Sig Start Date End Date Taking? Authorizing Provider  cephALEXin (KEFLEX) 500 MG capsule Take 1 capsule (500 mg total) by mouth 4 (four) times daily. 09/25/15 10/02/15  Willy Eddy, MD  dexamethasone  (DECADRON) 4 MG tablet Take 1 tablet (4 mg total) by mouth daily. 09/25/15 09/28/15  Willy Eddy, MD  EPINEPHrine (EPIPEN 2-PAK) 0.3 mg/0.3 mL IJ SOAJ injection Inject 0.3 mLs (0.3 mg total) into the muscle once. 09/25/15 09/25/15  Willy Eddy, MD  predniSONE (DELTASONE) 20 MG tablet Take 2 tablets (40 mg total) by mouth daily. 09/25/15 09/29/15  Willy Eddy, MD    Allergies Fentanyl; Meloxicam; Penicillins; Prednisone; and Hydromorphone  Family History  Problem Relation Age of Onset  . Cancer Paternal Grandfather     Esophageal  . Heart disease Paternal Grandmother   . Diabetes Maternal Grandmother   . Heart disease Maternal Grandfather   . Diabetes Maternal Grandfather   . Cancer Maternal Grandfather     Esophageal  . Heart disease Maternal Aunt     Social History Social History  Substance Use Topics  . Smoking status: Light Tobacco Smoker    Packs/day: 0.20    Last attempt to quit: 11/14/2009  . Smokeless tobacco: Never Used  . Alcohol use No    Review of Systems Patient denies headaches, rhinorrhea, blurry vision, numbness, shortness of breath, chest pain, edema, cough, abdominal pain, nausea, vomiting, diarrhea, dysuria, fevers, rashes or hallucinations unless otherwise stated above in HPI. ____________________________________________   PHYSICAL EXAM:  VITAL SIGNS: ED Triage Vitals  Enc Vitals Group     BP 09/25/15 0619 133/74     Pulse Rate 09/25/15 0619 (!) 104     Resp 09/25/15 0619 20  Temp 09/25/15 0619 98.3 F (36.8 C)     Temp Source 09/25/15 0619 Oral     SpO2 09/25/15 0619 100 %     Weight 09/25/15 0619 250 lb (113.4 kg)     Height 09/25/15 0619 5\' 8"  (1.727 m)     Head Circumference --      Peak Flow --      Pain Score 09/25/15 0620 8     Pain Loc --      Pain Edu? --      Excl. in GC? --     Constitutional: Alert and oriented. Well appearing and in no acute distress. Eyes: Conjunctivae are normal. PERRL. EOMI. Head: Atraumatic. Nose:  No congestion/rhinnorhea. Mouth/Throat: Mucous membranes are moist.  Oropharynx non-erythematous. Uvula is midline without edema Neck: No stridor. Painless ROM. No cervical spine tenderness to palpation Hematological/Lymphatic/Immunilogical: No cervical lymphadenopathy. Cardiovascular: Normal rate, regular rhythm. Grossly normal heart sounds.  Good peripheral circulation. Respiratory: Normal respiratory effort.  No retractions. No wheezing on exam Gastrointestinal: Soft and nontender. No distention. No abdominal bruits. No CVA tenderness. Musculoskeletal: No lower extremity tenderness nor edema.  No joint effusions.  Right CVA ttp Neurologic:  Normal speech and language. No gross focal neurologic deficits are appreciated. No gait instability. Skin:  Skin is warm, dry and intact. No rash noted. Psychiatric: Mood and affect are normal. Speech and behavior are normal.  ____________________________________________   LABS (all labs ordered are listed, but only abnormal results are displayed)  Results for orders placed or performed during the hospital encounter of 09/25/15 (from the past 24 hour(s))  Comprehensive metabolic panel     Status: Abnormal   Collection Time: 09/25/15  6:57 AM  Result Value Ref Range   Sodium 133 (L) 135 - 145 mmol/L   Potassium 3.1 (L) 3.5 - 5.1 mmol/L   Chloride 100 (L) 101 - 111 mmol/L   CO2 23 22 - 32 mmol/L   Glucose, Bld 109 (H) 65 - 99 mg/dL   BUN 14 6 - 20 mg/dL   Creatinine, Ser 4.09 0.44 - 1.00 mg/dL   Calcium 8.7 (L) 8.9 - 10.3 mg/dL   Total Protein 7.3 6.5 - 8.1 g/dL   Albumin 3.6 3.5 - 5.0 g/dL   AST 16 15 - 41 U/L   ALT 15 14 - 54 U/L   Alkaline Phosphatase 47 38 - 126 U/L   Total Bilirubin 1.2 0.3 - 1.2 mg/dL   GFR calc non Af Amer >60 >60 mL/min   GFR calc Af Amer >60 >60 mL/min   Anion gap 10 5 - 15  CBC     Status: Abnormal   Collection Time: 09/25/15  6:57 AM  Result Value Ref Range   WBC 13.8 (H) 3.6 - 11.0 K/uL   RBC 5.05 3.80 - 5.20  MIL/uL   Hemoglobin 15.0 12.0 - 16.0 g/dL   HCT 81.1 91.4 - 78.2 %   MCV 90.1 80.0 - 100.0 fL   MCH 29.6 26.0 - 34.0 pg   MCHC 32.9 32.0 - 36.0 g/dL   RDW 95.6 21.3 - 08.6 %   Platelets 261 150 - 440 K/uL  Urinalysis complete, with microscopic ([facility] only)     Status: Abnormal   Collection Time: 09/25/15  6:57 AM  Result Value Ref Range   Color, Urine AMBER (A) YELLOW   APPearance CLOUDY (A) CLEAR   Glucose, UA NEGATIVE NEGATIVE mg/dL   Bilirubin Urine NEGATIVE NEGATIVE   Ketones, ur NEGATIVE NEGATIVE mg/dL  Specific Gravity, Urine 1.019 1.005 - 1.030   Hgb urine dipstick 3+ (A) NEGATIVE   pH 6.0 5.0 - 8.0   Protein, ur 100 (A) NEGATIVE mg/dL   Nitrite POSITIVE (A) NEGATIVE   Leukocytes, UA 1+ (A) NEGATIVE   RBC / HPF TOO NUMEROUS TO COUNT 0 - 5 RBC/hpf   WBC, UA TOO NUMEROUS TO COUNT 0 - 5 WBC/hpf   Bacteria, UA MANY (A) NONE SEEN   Squamous Epithelial / LPF 6-30 (A) NONE SEEN   WBC Clumps PRESENT    Mucous PRESENT   Pregnancy, urine POC     Status: None   Collection Time: 09/25/15  9:02 AM  Result Value Ref Range   Preg Test, Ur NEGATIVE NEGATIVE    ____________________________________________   ____________________________________________  RADIOLOGY  None ____________________________________________   PROCEDURES  Procedure(s) performed: none    Critical Care performed: no ____________________________________________   INITIAL IMPRESSION / ASSESSMENT AND PLAN / ED COURSE  Pertinent labs & imaging results that were available during my care of the patient were reviewed by me and considered in my medical decision making (see chart for details).  DDX: urticaria, anaphylaxis, anaphylactoid, drug reaction, uti, pyelo, stone  Pleasant nontoxic uncomfortable presents with diffuse urticaria and pruritic rash. Patient is afebrile and clinically stable. Her airway evaluation shows no edema or respiratory distress. Her bowel exam is soft and benign. She does have  diffuse urticaria without evidence of overlying infection.  Patient will be given steroids, Pepcid and IV Benadryl. She is also complaining of dysuria and some flank pain we'll check urine but have low suspicion for pyelonephritis that she is afebrile and nontoxic-appearing.   Clinical Course  Comment By Time  Patient reassessed and urticaria appears to be improving.  No uvular edema.  No wheezing.  Bedside US without evidence of hydronephrosis.  Awaiting urine. Willy Eddy, MD 08/03 [phone]  Rechecked patient.  Drowsy 2/2 benadryl but maintaining sats and airway.  Urticaria resolving.   Willy Eddy, MD 08/03 [phone]  U preg negative Willy Eddy, MD 08/03 [phone]   The patient pregnant with evidence of gross bacteriuria and pyuria. Have low suspicion for stone as the pain is noncolicky in nature and she has bedside ultrasound without hydronephrosis.   We'll provide Keflex and as her urticaria has resolved and the patient has remained hemodynamic stable without airway involvement I will discharge home on by mouth steroids provided a prescription for EpiPen. I discussed strict return precautions and follow-up with PCP.  Have discussed with the patient and available family all diagnostics and treatments performed thus far and all questions were answered to the best of my ability. The patient demonstrates understanding and agreement with plan.      ____________________________________________   FINAL CLINICAL IMPRESSION(S) / ED DIAGNOSES  Final diagnoses:  Allergic reaction, initial encounter  Hives  Flank pain, acute  Hypokalemia  Acute pyelonephritis      NEW MEDICATIONS STARTED DURING THIS VISIT:  Discharge Medication List as of 09/25/2015  9:33 AM    START taking these medications   Details  cephALEXin (KEFLEX) 500 MG capsule Take 1 capsule (500 mg total) by mouth 4 (four) times daily., Starting Thu 09/25/2015, Until Thu 10/02/2015, Print    EPINEPHrine (EPIPEN 2-PAK) 0.3  mg/0.3 mL IJ SOAJ injection Inject 0.3 mLs (0.3 mg total) into the muscle once., Starting Thu 09/25/2015, Print    predniSONE (DELTASONE) 20 MG tablet Take 2 tablets (40 mg total) by mouth daily., Starting Thu 09/25/2015, Until  Mon 09/29/2015, Print         Note:  This document was prepared using Dragon voice recognition software and may include unintentional dictation errors.    Willy Eddy, MD 09/25/15 1010

## 2015-09-25 NOTE — ED Triage Notes (Signed)
Pt ambulatory to triage with c/o rash all over body and shortness of breath. Pt reports developed hives last night but woke up with hives all over body and difficulty breathing. Pt speaking in complete sentences. Pt denies change in laundry detergent, food, or medications. Hives noted to chest, back, and bilateral legs. Pt reports did not take benadryl at home.

## 2015-10-09 ENCOUNTER — Emergency Department
Admission: EM | Admit: 2015-10-09 | Discharge: 2015-10-10 | Disposition: A | Discharge status: Home / Self Care | Payer: Self-pay | Attending: Emergency Medicine | Admitting: Emergency Medicine

## 2015-10-09 ENCOUNTER — Encounter: Payer: Self-pay | Admitting: Emergency Medicine

## 2015-10-09 ENCOUNTER — Emergency Department: Payer: Self-pay

## 2015-10-09 DIAGNOSIS — M48061 Spinal stenosis, lumbar region without neurogenic claudication: Secondary | ICD-10-CM

## 2015-10-09 DIAGNOSIS — J45909 Unspecified asthma, uncomplicated: Secondary | ICD-10-CM | POA: Insufficient documentation

## 2015-10-09 DIAGNOSIS — R29898 Other symptoms and signs involving the musculoskeletal system: Secondary | ICD-10-CM

## 2015-10-09 DIAGNOSIS — F17200 Nicotine dependence, unspecified, uncomplicated: Secondary | ICD-10-CM | POA: Insufficient documentation

## 2015-10-09 DIAGNOSIS — M5440 Lumbago with sciatica, unspecified side: Secondary | ICD-10-CM | POA: Insufficient documentation

## 2015-10-09 DIAGNOSIS — M5441 Lumbago with sciatica, right side: Secondary | ICD-10-CM

## 2015-10-09 DIAGNOSIS — M4806 Spinal stenosis, lumbar region: Secondary | ICD-10-CM | POA: Insufficient documentation

## 2015-10-09 DIAGNOSIS — M549 Dorsalgia, unspecified: Secondary | ICD-10-CM

## 2015-10-09 DIAGNOSIS — M5442 Lumbago with sciatica, left side: Secondary | ICD-10-CM

## 2015-10-09 DIAGNOSIS — F902 Attention-deficit hyperactivity disorder, combined type: Secondary | ICD-10-CM | POA: Insufficient documentation

## 2015-10-09 LAB — CBC WITH DIFFERENTIAL/PLATELET
Basophils Absolute: 0.1 10*3/uL (ref 0–0.1)
Basophils Relative: 0 %
EOS ABS: 0.2 10*3/uL (ref 0–0.7)
HCT: 38.5 % (ref 35.0–47.0)
HEMOGLOBIN: 12.9 g/dL (ref 12.0–16.0)
LYMPHS ABS: 2 10*3/uL (ref 1.0–3.6)
Lymphocytes Relative: 15 %
MCH: 30.1 pg (ref 26.0–34.0)
MCHC: 33.7 g/dL (ref 32.0–36.0)
MCV: 89.3 fL (ref 80.0–100.0)
Monocytes Absolute: 0.9 10*3/uL (ref 0.2–0.9)
Neutro Abs: 10.7 10*3/uL — ABNORMAL HIGH (ref 1.4–6.5)
Neutrophils Relative %: 77 %
PLATELETS: 195 10*3/uL (ref 150–440)
RBC: 4.31 MIL/uL (ref 3.80–5.20)
RDW: 15.1 % — ABNORMAL HIGH (ref 11.5–14.5)
WBC: 13.9 10*3/uL — ABNORMAL HIGH (ref 3.6–11.0)

## 2015-10-09 LAB — COMPREHENSIVE METABOLIC PANEL
ALK PHOS: 44 U/L (ref 38–126)
ALT: 12 U/L — ABNORMAL LOW (ref 14–54)
ANION GAP: 5 (ref 5–15)
AST: 17 U/L (ref 15–41)
Albumin: 3.6 g/dL (ref 3.5–5.0)
BUN: 9 mg/dL (ref 6–20)
CHLORIDE: 104 mmol/L (ref 101–111)
CO2: 29 mmol/L (ref 22–32)
CREATININE: 0.66 mg/dL (ref 0.44–1.00)
Calcium: 8.9 mg/dL (ref 8.9–10.3)
Glucose, Bld: 87 mg/dL (ref 65–99)
Potassium: 4.2 mmol/L (ref 3.5–5.1)
SODIUM: 138 mmol/L (ref 135–145)
Total Bilirubin: 1.3 mg/dL — ABNORMAL HIGH (ref 0.3–1.2)
Total Protein: 6.7 g/dL (ref 6.5–8.1)

## 2015-10-09 LAB — URINALYSIS COMPLETE WITH MICROSCOPIC (ARMC ONLY)
BILIRUBIN URINE: NEGATIVE
GLUCOSE, UA: NEGATIVE mg/dL
Ketones, ur: NEGATIVE mg/dL
NITRITE: NEGATIVE
PH: 6 (ref 5.0–8.0)
Protein, ur: NEGATIVE mg/dL
SPECIFIC GRAVITY, URINE: 1.012 (ref 1.005–1.030)

## 2015-10-09 LAB — POCT PREGNANCY, URINE: PREG TEST UR: NEGATIVE

## 2015-10-09 MED ORDER — GADOBENATE DIMEGLUMINE 529 MG/ML IV SOLN
20.0000 mL | Freq: Once | INTRAVENOUS | Status: DC | PRN
  Filled 2015-10-09: qty 20

## 2015-10-09 MED ORDER — PREDNISONE 10 MG PO TABS: ORAL_TABLET | ORAL | 0 refills | Status: DC

## 2015-10-09 MED ORDER — CYCLOBENZAPRINE HCL 10 MG PO TABS: 10.0000 mg | ORAL_TABLET | Freq: Three times a day (TID) | ORAL | 0 refills | Status: DC | PRN

## 2015-10-09 MED ORDER — LORAZEPAM 2 MG/ML IJ SOLN
1.0000 mg | Freq: Once | INTRAMUSCULAR | Status: AC
  Administered 2015-10-09: 1 mg via INTRAVENOUS

## 2015-10-09 MED ORDER — LORAZEPAM 2 MG/ML IJ SOLN
INTRAMUSCULAR | Status: AC
  Administered 2015-10-09: 1 mg via INTRAVENOUS
  Filled 2015-10-09: qty 1

## 2015-10-09 MED ORDER — METHYLPREDNISOLONE SODIUM SUCC 125 MG IJ SOLR
80.0000 mg | Freq: Once | INTRAMUSCULAR | Status: AC
  Administered 2015-10-09: 80 mg via INTRAMUSCULAR

## 2015-10-09 MED ORDER — LORAZEPAM 2 MG/ML IJ SOLN
1.0000 mg | Freq: Once | INTRAMUSCULAR | Status: AC
  Administered 2015-10-09: 1 mg via INTRAVENOUS
  Filled 2015-10-09: qty 1

## 2015-10-09 MED ORDER — METHYLPREDNISOLONE SODIUM SUCC 125 MG IJ SOLR
80.0000 mg | Freq: Once | INTRAMUSCULAR | Status: DC
  Filled 2015-10-09: qty 2

## 2015-10-09 MED ORDER — ACETAMINOPHEN 325 MG PO TABS: 650.0000 mg | ORAL_TABLET | Freq: Four times a day (QID) | ORAL | 0 refills | Status: DC | PRN

## 2015-10-09 NOTE — ED Notes (Signed)
PA talking to to neuro for a consult.

## 2015-10-09 NOTE — ED Notes (Signed)
PA spoke to pt in regards to  f/u with neuro surgery as soon as appt was available. Recommendation was for steroid shot however pt states she had a reaction long time ago to prednisone. Thinks she had hives unsure of SOB. PA going to talk to MD in regards to allergies to prednisone. Pt states pain is 10/10 to back. Legs feel numb and tingles. Pt states unsure of what she is going to do when she gets home.

## 2015-10-09 NOTE — ED Notes (Signed)
Taken to MRI via stretcher  .Tammy Hickman. Jann EDT present with pt
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
## 2015-10-09 NOTE — ED Notes (Addendum)
Called [facility] at [phone] to inquire about MRI results.  Radiologist there stated they would speak to neuroradiologist about results and call back this RN if needed.

## 2015-10-09 NOTE — ED Notes (Signed)
Pts mother left phone number and requested pt call her when pt returned from MRI.  [phone]

## 2015-10-09 NOTE — ED Provider Notes (Signed)
[facility] Emergency Department Provider Note   ____________________________________________    I have reviewed the triage vital signs and the nursing notes.   HISTORY  Chief Complaint Back Pain   HPI Tammy Hickman is a 25 y.o. female who presents with complaints of low back pain and weakness and "heaviness" in her right leg. She also describes it as "numb". She reports this is becoming gradually worse over the last 2 weeks. Today she says she was unable to walk/bear weight on the right leg. She denies injury to the area. She has never had this before. She denies IV drug abuse. She denies fevers.   Past Medical History:  Diagnosis Date  . Anxiety   . Asthma   . Bipolar disorder (HCC)   . Hernia, umbilical 2011  . Mental disorders of mother, antepartum   . Obesity complicating pregnancy, childbirth, or the puerperium, antepartum condition or complication(649.13)   . Other specified indication for care or intervention related to labor and delivery, antepartum   . Thyroid dysfunction     Patient Active Problem List   Diagnosis Date Noted  . Low back pain 01/13/2015  . Leg pain, right 01/13/2015  . Opiate overdose 10/31/2014  . Dysthymia 10/31/2014  . ADHD (attention deficit hyperactivity disorder) 10/31/2014  . Obesity 07/27/2011  . Contraception, generic surveillance 07/27/2011    Past Surgical History:  Procedure Laterality Date  . ABDOMINAL SURGERY    . HERNIA REPAIR     umbilical hernia  . TONSILLECTOMY  2009    Prior to Admission medications   Not on File     Allergies Fentanyl; Meloxicam; Penicillins; Prednisone; and Hydromorphone  Family History  Problem Relation Age of Onset  . Cancer Paternal Grandfather     Esophageal  . Heart disease Paternal Grandmother   . Diabetes Maternal Grandmother   . Heart disease Maternal Grandfather   . Diabetes Maternal Grandfather   . Cancer Maternal Grandfather     Esophageal    . Heart disease Maternal Aunt     Social History Social History  Substance Use Topics  . Smoking status: Light Tobacco Smoker    Packs/day: 0.20    Last attempt to quit: 11/14/2009  . Smokeless tobacco: Never Used  . Alcohol use No    Review of Systems  Constitutional: No fever/chills Eyes: No visual changes.  ENT: No sore throat. Cardiovascular: Denies chest pain. Respiratory: Denies shortness of breath. Gastrointestinal: No abdominal pain.  No nausea, no vomiting.   Genitourinary: Negative for dysuria. Musculoskeletal: Positive for back pain Skin: Negative for rash. Neurological: Negative for headaches   10-point ROS otherwise negative.  ____________________________________________   PHYSICAL EXAM:  VITAL SIGNS: ED Triage Vitals  Enc Vitals Group     BP 10/09/15 1402 (!) 136/92     Pulse Rate 10/09/15 1402 91     Resp 10/09/15 1402 20     Temp 10/09/15 1402 98.4 F (36.9 C)     Temp Source 10/09/15 1402 Oral     SpO2 10/09/15 1402 98 %     Weight 10/09/15 1357 250 lb (113.4 kg)     Height 10/09/15 1357 5\' 8"  (1.727 m)     Head Circumference --      Peak Flow --      Pain Score 10/09/15 1357 7     Pain Loc --      Pain Edu? --      Excl. in GC? --  Constitutional: Alert and oriented. No acute distress. Anxious Eyes: Conjunctivae are normal.  Head: Atraumatic. Nose: No congestion/rhinnorhea. Mouth/Throat: Mucous membranes are moist.   Neck:  Painless ROM Cardiovascular: Normal rate, regular rhythm. Grossly normal heart sounds.  Good peripheral circulation. Respiratory: Normal respiratory effort.  No retractions. Lungs CTAB. Gastrointestinal: Soft and nontender. No distention.  No CVA tenderness. Genitourinary: deferred Musculoskeletal: Mild tenderness to palpation over the L2-L4 vertebrae, no erythema or swelling. Neurologic:  Normal speech and language. Decreased strength in the right leg with extension at the hip. Unable to bear weight on  ambulation Skin:  Skin is warm, dry and intact. No rash noted. Psychiatric: Mood and affect are normal. Speech and behavior are normal.  ____________________________________________   LABS (all labs ordered are listed, but only abnormal results are displayed)  Labs Reviewed  CBC WITH DIFFERENTIAL/PLATELET - Abnormal; Notable for the following:       Result Value   WBC 13.9 (*)    RDW 15.1 (*)    Neutro Abs 10.7 (*)    All other components within normal limits  COMPREHENSIVE METABOLIC PANEL - Abnormal; Notable for the following:    ALT 12 (*)    Total Bilirubin 1.3 (*)    All other components within normal limits   ____________________________________________  EKG  None ____________________________________________  RADIOLOGY  MRI thoracic and lumbar spine pending ____________________________________________   PROCEDURES  Procedure(s) performed: No    Critical Care performed: No ____________________________________________   INITIAL IMPRESSION / ASSESSMENT AND PLAN / ED COURSE  Pertinent labs & imaging results that were available during my care of the patient were reviewed by me and considered in my medical decision making (see chart for details).  Most likely diagnosis is sciatica however patient's report of leg heaviness and weakness and inability to ambulate is certainly concerning, MRI ordered to further evaluate..  Clinical Course  ----------------------------------------- 5:31 PM on 10/09/2015 -----------------------------------------  Patient to MRI ____________________________________________   FINAL CLINICAL IMPRESSION(S) / ED DIAGNOSES  Back pain    NEW MEDICATIONS STARTED DURING THIS VISIT:  New Prescriptions   No medications on file     Note:  This document was prepared using Dragon voice recognition software and may include unintentional dictation errors.    Jene Everyobert Arabelle Bollig, MD 10/09/15 [phone]

## 2015-10-09 NOTE — ED Notes (Signed)
Pt alert and oriented x 4, but drowsy.  Able to follow commands and move limbs freely.  Pt on phone w/ mother for ride.

## 2015-10-09 NOTE — ED Notes (Signed)
MRI called and stated that pt having difficult time staying still during MRI.  Informed Jami PA.

## 2015-10-09 NOTE — ED Notes (Signed)
Pt informed that he mother called and had requested pt call, pt given cell phone.

## 2015-10-09 NOTE — ED Provider Notes (Signed)
Physical Exam  BP (!) 126/57 (BP Location: Left Arm)   Pulse 85   Temp 98.4 F (36.9 C) (Oral)   Resp 20   Ht 5\' 8"  (1.727 m)   Wt 113.4 kg   LMP 09/25/2015   SpO2 98%   BMI 38.01 kg/m   Physical Exam as documented by Dr. Jene Everyobert Hickman during initial assessment.   ED Course  Procedures  MDM Tammy Hickman is 25 year old female presents to the emergency with complaints of lower back pain, weakness, numbness and "heaviness" in her right leg. Initial evaluation and assessment was completed by Dr. Jene Everyobert Hickman. Please refer to previous notes for full details.   At the time of hand off, the patient was in the process of having MRI of the lumbar and thoracic spine.   ----------------------------------------- 10:22 PM on 10/09/2015 -----------------------------------------  At this time MRI of lumbar spine has resulted with no acute fractures or malalignment. Advanced degenerative changes are noted throughout the lumbar spine. It is noted that the imaging is limited due to moderate to severely motion degraded images. Results for the MRI of the thoracic spine has not been relayed. We are contacting the radiologist to get an estimate of when we will have those results.  ----------------------------------------- 10:31 PM on 10/09/2015 -----------------------------------------  Tammy FiddlerWe've been informed that the MRI of the thoracic spine will not be read as optimal images could not be obtained due to the patient's movement during the process. Since the patient has been back in her exam room she has been resting well and sleeping.   ----------------------------------------- 11:07 PM on 10/09/2015 -----------------------------------------  Due to MRI results and the patient's symptoms, [facility].   ----------------------------------------- 11:23 PM on 10/09/2015 -----------------------------------------  I have spoken with Dr. Wynetta Emeryram [facility] in regards to the patient's history, physical exam and imaging results. Patient may be treated with steroids, pain medications and muscle relaxers at this time. Patient is to follow up with Dr. Wynetta Emeryram in his office as soon as an appointment can be scheduled. I discussed the patient's allergy to prednisone and he highly suggest that we attempt use of steroids to assist with pain and decreasing inflammation. Since the patient does not had anaphylactic reaction to steroids we will discuss the option of treatment.  ----------------------------------------- 11:31 PM on 10/09/2015 -----------------------------------------  Spoke with the patient in regards to my discussion with Dr. Wynetta Emeryram. She is hesitant to take prednisone or other steroids due to her previous reaction. She believes that she may have had hives and shortness of breath but states that the last time she took prednisone was so long ago that she can't particularly remember the reaction. Patient is in agreement to receive an IM injection of steroids and be monitored in the emergency department for adverse events. We will talk with Dr. Manson PasseyBrown in regards to this plan.   ----------------------------------------- 11:43 PM on 10/09/2015 -----------------------------------------  I have spoken with Dr. Manson PasseyBrown in regards to the patient receiving IM steroids. He is in agreement and will check on the patient in 30 minutes to ensure she tolerates the medication well without side effects. I have written a prescription for a prednisone Dosepak in which the patient may take if she tolerates the Solu-Medrol without adverse event. I also discussed with Dr. Manson PasseyBrown that I was uncomfortable prescribing opioid pain medication to the patient as she has a history of opioid overdose in 2016. He is in agreement with continuing the patient  on Tylenol, Flexeril for pain control.    Pertinent labs and imaging results that were available during my care  of the patient were reviewed by me and considered in my medical decision making (see chart for details).  Patient has been informed of her imaging results as well as the inability to complete MRI of the thoracic spine. All questions have been answered to the patient's satisfaction. Patient will be discharged with a diagnosis of lumbar stenosis, lower back pain with sciatica. Considering the patient's significant allergies, it is best that she take Tylenol as needed for pain. I have also prescribed Flexeril 10 mg for the patient to take one tablet by mouth every 8 hours as needed for muscle spasms. A prescription for prednisone 10 mg Dosepak has been written for the patient and will be given to her if she tolerates I am Solu-Medrol well without adverse events. Dr. Manson PasseyBrown will provide this prescription to the patient once she has been monitored after her IM injection. Patient will follow with Dr. Wynetta Emeryram in neurosurgery as soon as an appointment may be scheduled. I have advised that the patient should follow up with her primary care provider as needed. Patient is given ED precautions to return to the emergency department for any new or worsening symptoms.  It should be noted that the patient has verbalized to me that financially she is unable to purchase many medications nor attends medical appointments as she is uninsured. Patient also relates that she has unreliable transportation. I have urged the patient that it is imperative that she follow-up with neurosurgery as her symptoms and pain will only worsen without proper medical care.     Tammy PigeonJami L Genella Bas, PA-C 10/09/15 2348   ----------------------------------------- 12:25 AM on 10/10/2015 -----------------------------------------  Patient is observed sleeping in the exam room without evidence of difficulty breathing nor any rashes. Patient has tolerated I am Solu-Medrol without any adverse events including but not limited to rash, itching, hives, shortness  of breath. Patient will be given prescription for prednisone Dosepak to take one outpatient basis. Again I have reemphasized to the patient that it is in her best medical interest that she follow-up with Dr. Wynetta Emerycram in neurosurgery to continue treatment of her lumbar stenosis and pain.   Tammy PigeonJami L Thurley Francesconi, PA-C 10/10/15 [id number]    Tammy Everyobert Kinner, MD 10/11/15 [phone]

## 2015-10-10 MED ORDER — ACETAMINOPHEN 325 MG PO TABS
ORAL_TABLET | ORAL | Status: AC
  Filled 2015-10-10: qty 2

## 2015-10-10 MED ORDER — ACETAMINOPHEN 325 MG PO TABS
650.0000 mg | ORAL_TABLET | Freq: Once | ORAL | Status: AC
  Administered 2015-10-10: 650 mg via ORAL

## 2015-10-10 NOTE — ED Notes (Signed)
Discharge instructions reviewed with patient. Questions fielded by this RN. Patient verbalizes understanding of instructions. Patient discharged home in stable condition per Manson PasseyBrown MD and [place] PA . No acute distress noted at time of discharge.

## 2015-10-10 NOTE — ED Notes (Signed)
Pt tolerated IM Solu-medrol well no reaction noted. Pt is still trying to find a ride.

## 2015-10-10 NOTE — ED Notes (Signed)
Pt to be discharge to the lobby per Charge nurse OrangeBryan. First nurse Misty StanleyLisa made aware.

## 2015-11-03 ENCOUNTER — Inpatient Hospital Stay
Admission: EM | Admit: 2015-11-03 | Discharge: 2015-11-04 | DRG: 948 | Disposition: A | Discharge status: Home / Self Care | Payer: Self-pay | Attending: Internal Medicine | Admitting: Internal Medicine

## 2015-11-03 ENCOUNTER — Emergency Department: Payer: Self-pay

## 2015-11-03 ENCOUNTER — Encounter: Payer: Self-pay | Admitting: Emergency Medicine

## 2015-11-03 DIAGNOSIS — M7989 Other specified soft tissue disorders: Secondary | ICD-10-CM

## 2015-11-03 DIAGNOSIS — J45909 Unspecified asthma, uncomplicated: Secondary | ICD-10-CM | POA: Diagnosis present

## 2015-11-03 DIAGNOSIS — R778 Other specified abnormalities of plasma proteins: Secondary | ICD-10-CM

## 2015-11-03 DIAGNOSIS — Z9889 Other specified postprocedural states: Secondary | ICD-10-CM

## 2015-11-03 DIAGNOSIS — M545 Low back pain: Secondary | ICD-10-CM

## 2015-11-03 DIAGNOSIS — R7989 Other specified abnormal findings of blood chemistry: Secondary | ICD-10-CM

## 2015-11-03 DIAGNOSIS — I214 Non-ST elevation (NSTEMI) myocardial infarction: Secondary | ICD-10-CM | POA: Diagnosis present

## 2015-11-03 DIAGNOSIS — F419 Anxiety disorder, unspecified: Secondary | ICD-10-CM | POA: Diagnosis present

## 2015-11-03 DIAGNOSIS — R748 Abnormal levels of other serum enzymes: Principal | ICD-10-CM | POA: Diagnosis present

## 2015-11-03 DIAGNOSIS — Z8249 Family history of ischemic heart disease and other diseases of the circulatory system: Secondary | ICD-10-CM

## 2015-11-03 DIAGNOSIS — Z833 Family history of diabetes mellitus: Secondary | ICD-10-CM

## 2015-11-03 DIAGNOSIS — Z809 Family history of malignant neoplasm, unspecified: Secondary | ICD-10-CM

## 2015-11-03 DIAGNOSIS — F17210 Nicotine dependence, cigarettes, uncomplicated: Secondary | ICD-10-CM | POA: Diagnosis present

## 2015-11-03 DIAGNOSIS — F319 Bipolar disorder, unspecified: Secondary | ICD-10-CM | POA: Diagnosis present

## 2015-11-03 DIAGNOSIS — F1410 Cocaine abuse, uncomplicated: Secondary | ICD-10-CM

## 2015-11-03 DIAGNOSIS — Z888 Allergy status to other drugs, medicaments and biological substances status: Secondary | ICD-10-CM

## 2015-11-03 DIAGNOSIS — Z880 Allergy status to penicillin: Secondary | ICD-10-CM

## 2015-11-03 LAB — CBC
HCT: 42.5 % (ref 35.0–47.0)
Hemoglobin: 14.3 g/dL (ref 12.0–16.0)
MCH: 29.5 pg (ref 26.0–34.0)
MCHC: 33.6 g/dL (ref 32.0–36.0)
MCV: 87.7 fL (ref 80.0–100.0)
Platelets: 246 10*3/uL (ref 150–440)
RBC: 4.84 MIL/uL (ref 3.80–5.20)
RDW: 15 % — ABNORMAL HIGH (ref 11.5–14.5)
WBC: 5.1 10*3/uL (ref 3.6–11.0)

## 2015-11-03 LAB — TROPONIN I
TROPONIN I: 0.45 ng/mL — AB (ref <0.03)
Troponin I: 0.49 ng/mL (ref <0.03)
Troponin I: 0.48 ng/mL (ref <0.03)

## 2015-11-03 LAB — MAGNESIUM: Magnesium: 2.2 mg/dL (ref 1.7–2.4)

## 2015-11-03 LAB — HEPARIN LEVEL (UNFRACTIONATED): Heparin Unfractionated: <0.1 IU/mL — ABNORMAL LOW (ref 0.30–0.70)

## 2015-11-03 LAB — URINE DRUG SCREEN, QUALITATIVE (ARMC ONLY)
Amphetamines, Ur Screen: NOT DETECTED
BARBITURATES, UR SCREEN: NOT DETECTED
BENZODIAZEPINE, UR SCRN: POSITIVE — AB
CANNABINOID 50 NG, UR ~~LOC~~: POSITIVE — AB
Cocaine Metabolite,Ur ~~LOC~~: POSITIVE — AB
MDMA (Ecstasy)Ur Screen: NOT DETECTED
METHADONE SCREEN, URINE: NOT DETECTED
Opiate, Ur Screen: NOT DETECTED
Phencyclidine (PCP) Ur S: NOT DETECTED
TRICYCLIC, UR SCREEN: NOT DETECTED

## 2015-11-03 LAB — BASIC METABOLIC PANEL
ANION GAP: 6 (ref 5–15)
BUN: <5 mg/dL — ABNORMAL LOW (ref 6–20)
CHLORIDE: 105 mmol/L (ref 101–111)
CO2: 27 mmol/L (ref 22–32)
Calcium: 8.9 mg/dL (ref 8.9–10.3)
Creatinine, Ser: 0.7 mg/dL (ref 0.44–1.00)
Glucose, Bld: 102 mg/dL — ABNORMAL HIGH (ref 65–99)
POTASSIUM: 3.7 mmol/L (ref 3.5–5.1)
SODIUM: 138 mmol/L (ref 135–145)

## 2015-11-03 LAB — APTT: aPTT: 35 seconds (ref 24–36)

## 2015-11-03 LAB — PROTIME-INR
INR: 0.92
PROTHROMBIN TIME: 12.3 s (ref 11.4–15.2)

## 2015-11-03 LAB — BRAIN NATRIURETIC PEPTIDE: B Natriuretic Peptide: 61 pg/mL (ref 0.0–100.0)

## 2015-11-03 LAB — TSH: TSH: 1.14 u[IU]/mL (ref 0.350–4.500)

## 2015-11-03 MED ORDER — HEPARIN BOLUS VIA INFUSION
2600.0000 [IU] | Freq: Once | INTRAVENOUS | Status: AC
  Administered 2015-11-03: 2600 [IU] via INTRAVENOUS
  Filled 2015-11-03: qty 2600

## 2015-11-03 MED ORDER — HYDROCODONE-ACETAMINOPHEN 5-325 MG PO TABS
1.0000 | ORAL_TABLET | ORAL | Status: DC | PRN
  Administered 2015-11-03 – 2015-11-04 (×3): 1 via ORAL
  Filled 2015-11-03 (×3): qty 1

## 2015-11-03 MED ORDER — NITROGLYCERIN 0.4 MG SL SUBL: 0.4000 mg | SUBLINGUAL_TABLET | SUBLINGUAL | Status: DC | PRN

## 2015-11-03 MED ORDER — ACETAMINOPHEN 325 MG PO TABS: 650.0000 mg | ORAL_TABLET | ORAL | Status: DC | PRN

## 2015-11-03 MED ORDER — ONDANSETRON HCL 4 MG/2ML IJ SOLN: 4.0000 mg | Freq: Four times a day (QID) | INTRAMUSCULAR | Status: DC | PRN

## 2015-11-03 MED ORDER — HEPARIN (PORCINE) IN NACL 100-0.45 UNIT/ML-% IJ SOLN
1400.0000 [IU]/h | INTRAMUSCULAR | Status: DC
  Administered 2015-11-03 – 2015-11-04 (×2): 1400 [IU]/h via INTRAVENOUS
  Filled 2015-11-03 (×2): qty 250

## 2015-11-03 MED ORDER — ASPIRIN EC 81 MG PO TBEC
81.0000 mg | DELAYED_RELEASE_TABLET | Freq: Every day | ORAL | Status: DC
  Administered 2015-11-04: 81 mg via ORAL
  Filled 2015-11-03: qty 1

## 2015-11-03 MED ORDER — ASPIRIN 81 MG PO CHEW
324.0000 mg | CHEWABLE_TABLET | Freq: Once | ORAL | Status: AC
  Administered 2015-11-03: 324 mg via ORAL
  Filled 2015-11-03: qty 4

## 2015-11-03 MED ORDER — ATORVASTATIN CALCIUM 20 MG PO TABS
40.0000 mg | ORAL_TABLET | Freq: Every day | ORAL | Status: DC
  Administered 2015-11-03: 40 mg via ORAL
  Filled 2015-11-03: qty 2

## 2015-11-03 MED ORDER — TRAMADOL HCL 50 MG PO TABS
50.0000 mg | ORAL_TABLET | Freq: Four times a day (QID) | ORAL | Status: DC | PRN
  Administered 2015-11-03: 50 mg via ORAL
  Filled 2015-11-03: qty 1

## 2015-11-03 MED ORDER — HEPARIN SODIUM (PORCINE) 5000 UNIT/ML IJ SOLN
4000.0000 [IU] | Freq: Once | INTRAMUSCULAR | Status: DC
  Filled 2015-11-03: qty 1

## 2015-11-03 MED ORDER — HEPARIN BOLUS VIA INFUSION
4000.0000 [IU] | Freq: Once | INTRAVENOUS | Status: AC
  Administered 2015-11-03: 4000 [IU] via INTRAVENOUS
  Filled 2015-11-03: qty 4000

## 2015-11-03 MED ORDER — IOPAMIDOL (ISOVUE-370) INJECTION 76%
100.0000 mL | Freq: Once | INTRAVENOUS | Status: AC | PRN
  Administered 2015-11-03: 100 mL via INTRAVENOUS

## 2015-11-03 MED ORDER — HEPARIN (PORCINE) IN NACL 100-0.45 UNIT/ML-% IJ SOLN
1100.0000 [IU]/h | Freq: Once | INTRAMUSCULAR | Status: AC
  Administered 2015-11-03: 1100 [IU]/h via INTRAVENOUS
  Filled 2015-11-03: qty 250

## 2015-11-03 NOTE — ED Notes (Addendum)
Pt c/o back pain and bilat leg pain today - states a few days ago she had an episode of chest pain and difficulty breathing - respirations even and unlabored at this time - denies chest pain - denies nausea/vomiting

## 2015-11-03 NOTE — ED Notes (Signed)
Lab called to verify they could add on BMP and BNP - they stated they could add on this testing

## 2015-11-03 NOTE — ED Triage Notes (Signed)
Pt to ed with c/o left knee, left leg pain and back pain.  Pt states she was seen here about 1 month ago for same.

## 2015-11-03 NOTE — ED Notes (Signed)
Pt with CT. 

## 2015-11-03 NOTE — ED Notes (Signed)
Pharmacy notified of need for heparin

## 2015-11-03 NOTE — Progress Notes (Signed)
ANTICOAGULATION CONSULT NOTE - Initial Consult  Pharmacy Consult for heparin drip  Indication: chest pain/ACS  Allergies  Allergen Reactions  . Fentanyl Hives  . Meloxicam Other (See Comments)    Knees locked up  . Penicillins Other (See Comments)    Chest pains  . Prednisone     Unsure of reaction  . Hydromorphone Hives and Rash    States had redness around IV site during IV admin. States given benadryl and went away.    Patient Measurements: Height: 5\' 8"  (172.7 cm) Weight: 261 lb 6.4 oz (118.6 kg) IBW/kg (Calculated) : 63.9 Heparin Dosing Weight: 88kg  Vital Signs: Temp: 98.3 F (36.8 C) (09/11 2059) Temp Source: Oral (09/11 2059) BP: 132/66 (09/11 2059) Pulse Rate: 91 (09/11 2059)  Labs:  Recent Labs  11/03/15 1229 11/03/15 1230 11/03/15 1727 11/03/15 2126  HGB  --  14.3  --   --   HCT  --  42.5  --   --   PLT  --  246  --   --   APTT 35  --   --   --   LABPROT 12.3  --   --   --   INR 0.92  --   --   --   HEPARINUNFRC  --   --   --  <0.10*  CREATININE  --  0.70  --   --   TROPONINI  --  0.49* 0.48* 0.45*    Estimated Creatinine Clearance: 146.9 mL/min (by C-G formula based on SCr of 0.8 mg/dL).   Medical History: Past Medical History:  Diagnosis Date  . Anxiety   . Asthma   . Bipolar disorder (HCC)   . Hernia, umbilical 2011  . Mental disorders of mother, antepartum   . Obesity complicating pregnancy, childbirth, or the puerperium, antepartum condition or complication(649.13)   . Other specified indication for care or intervention related to labor and delivery, antepartum   . Thyroid dysfunction     Assessment: 25 yo patient with chest pain. Pharmacy consulted for heparin dosing and monitoring.  Goal of Therapy:  Heparin level 0.3-0.7 units/ml Monitor platelets by anticoagulation protocol: Yes   Plan:  Dosing Weight: 88kg Baseline aPTT and PT/INR ordered.  Give 4000 units bolus x 1 Start heparin infusion at 1100 units/hr Check anti-Xa  level in 6 hours and daily while on heparin Continue to monitor H&H and platelets  11/03/15 2126 HL subtherapeutic. 2600 units IV x 1 bolus and increase rate to 1400 units/hr. Will recheck heparin level in 6 hours.  Kameah Rawl A. Hardyookson, Vermont[place]arm.D., BCPS Clinical Pharmacist 11/03/2015 11:02 PM

## 2015-11-03 NOTE — Progress Notes (Signed)
ANTICOAGULATION CONSULT NOTE - Initial Consult  Pharmacy Consult for heparin drip  Indication: chest pain/ACS  Allergies  Allergen Reactions  . Fentanyl Hives  . Meloxicam Other (See Comments)    Knees locked up  . Penicillins Other (See Comments)    Chest pains  . Prednisone     Unsure of reaction  . Hydromorphone Hives and Rash    States had redness around IV site during IV admin. States given benadryl and went away.    Patient Measurements: Height: 5\' 8"  (172.7 cm) Weight: 240 lb (108.9 kg) IBW/kg (Calculated) : 63.9 Heparin Dosing Weight: 88kg  Vital Signs: Temp: 98.9 F (37.2 C) (09/11 1207) Temp Source: Oral (09/11 1207) BP: 115/76 (09/11 1207) Pulse Rate: 108 (09/11 1207)  Labs:  Recent Labs  11/03/15 1230  HGB 14.3  HCT 42.5  PLT 246  CREATININE 0.70  TROPONINI 0.49*    Estimated Creatinine Clearance: 140.2 mL/min (by C-G formula based on SCr of 0.8 mg/dL).   Medical History: Past Medical History:  Diagnosis Date  . Anxiety   . Asthma   . Bipolar disorder (HCC)   . Hernia, umbilical 2011  . Mental disorders of mother, antepartum   . Obesity complicating pregnancy, childbirth, or the puerperium, antepartum condition or complication(649.13)   . Other specified indication for care or intervention related to labor and delivery, antepartum   . Thyroid dysfunction     Assessment: 25 yo patient with chest pain. Pharmacy consulted for heparin dosing and monitoring.  Goal of Therapy:  Heparin level 0.3-0.7 units/ml Monitor platelets by anticoagulation protocol: Yes   Plan:  Dosing Weight: 88kg Baseline aPTT and PT/INR ordered.  Give 4000 units bolus x 1 Start heparin infusion at 1100 units/hr Check anti-Xa level in 6 hours and daily while on heparin Continue to monitor H&H and platelets  Cher NakaiSheema Mancel Lardizabal, PharmD Clinical Pharmacist 11/03/2015 2:02 PM

## 2015-11-03 NOTE — H&P (Signed)
[facility] [place] [facility]   PATIENT NAME: Tammy Hickman    MR#:  [id number]  DATE OF BIRTH:  1990/10/20  DATE OF ADMISSION:  11/03/2015  PRIMARY CARE PHYSICIAN: Fidel Levy, MD   REQUESTING/REFERRING PHYSICIAN: Emily Filbert, MD  CHIEF COMPLAINT:   Chief Complaint  Patient presents with  . Leg Swelling  . Back Pain    back pain today. HISTORY OF PRESENT ILLNESS:  Tammy Hickman  is a 25 y.o. female with a known history of Bipolar disorder, anxiety and asthma. The patient presents to the ED due to back pain today. The patient denies any chest pain, palpitation or orthopnea. But has leg edema for 4 days. Patient was noticed to have elevated troponin at 0.49. CT angiogram didn't show any PE or dissection. Patient used cocaine 1 week ago. Urine toxicology showed positive cocaine.  PAST MEDICAL HISTORY:   Past Medical History:  Diagnosis Date  . Anxiety   . Asthma   . Bipolar disorder (HCC)   . Hernia, umbilical 2011  . Mental disorders of mother, antepartum   . Obesity complicating pregnancy, childbirth, or the puerperium, antepartum condition or complication(649.13)   . Other specified indication for care or intervention related to labor and delivery, antepartum   . Thyroid dysfunction     PAST SURGICAL HISTORY:   Past Surgical History:  Procedure Laterality Date  . ABDOMINAL SURGERY    . HERNIA REPAIR     umbilical hernia  . TONSILLECTOMY  2009    SOCIAL HISTORY:   Social History  Substance Use Topics  . Smoking status: Light Tobacco Smoker    Packs/day: 0.20    Last attempt to quit: 11/14/2009  . Smokeless tobacco: Never Used  . Alcohol use No    FAMILY HISTORY:   Family History  Problem Relation Age of Onset  . Cancer Paternal Grandfather     Esophageal  . Heart disease Paternal Grandmother   . Diabetes Maternal Grandmother   . Heart disease Maternal Grandfather   . Diabetes Maternal Grandfather   .  Cancer Maternal Grandfather     Esophageal  . Heart disease Maternal Aunt     DRUG ALLERGIES:   Allergies  Allergen Reactions  . Fentanyl Hives  . Meloxicam Other (See Comments)    Knees locked up  . Penicillins Other (See Comments)    Chest pains  . Prednisone     Unsure of reaction  . Hydromorphone Hives and Rash    States had redness around IV site during IV admin. States given benadryl and went away.    REVIEW OF SYSTEMS:   Review of Systems  Constitutional: Negative for chills, fever and malaise/fatigue.  HENT: Negative for sore throat.   Eyes: Negative for blurred vision and double vision.  Respiratory: Negative for cough, shortness of breath and wheezing.   Cardiovascular: Positive for leg swelling. Negative for chest pain and palpitations.  Gastrointestinal: Positive for nausea. Negative for abdominal pain, blood in stool, diarrhea, melena and vomiting.  Genitourinary: Negative for dysuria and urgency.  Musculoskeletal: Positive for back pain.  Neurological: Negative for dizziness, tingling, focal weakness, seizures, loss of consciousness and headaches.  Psychiatric/Behavioral: Negative for depression.    MEDICATIONS AT HOME:   Prior to Admission medications   Medication Sig Start Date End Date Taking? Authorizing Provider  acetaminophen (TYLENOL) 325 MG tablet Take 2 tablets (650 mg total) by mouth every 6 (six) hours as needed for moderate pain. 10/09/15  Jami L Hagler, PA-C  cyclobenzaprine (FLEXERIL) 10 MG tablet Take 1 tablet (10 mg total) by mouth 3 (three) times daily as needed for muscle spasms. 10/09/15   Jami L Hagler, PA-C  predniSONE (DELTASONE) 10 MG tablet Take a daily regimen of 6,5,4,3,2,1 10/09/15   Jami L Hagler, PA-C      VITAL SIGNS:  Blood pressure 135/77, pulse 98, temperature 98.9 F (37.2 C), temperature source Oral, resp. rate 18, height 5\' 8"  (1.727 m), weight 240 lb (108.9 kg), last menstrual period 10/24/2015, SpO2 100 %.  PHYSICAL  EXAMINATION:  Physical Exam  GENERAL:  25 y.o.-year-old patient lying in the bed with no acute distress.  EYES: Pupils equal, round, reactive to light and accommodation. No scleral icterus. Extraocular muscles intact.  HEENT: Head atraumatic, normocephalic. Oropharynx and nasopharynx clear.  NECK:  Supple, no jugular venous distention. No thyroid enlargement, no tenderness.  LUNGS: Normal breath sounds bilaterally, no wheezing, rales,rhonchi or crepitation. No use of accessory muscles of respiration.  CARDIOVASCULAR: S1, S2 normal. No murmurs, rubs, or gallops.  ABDOMEN: Soft, nontender, nondistended. Bowel sounds present. No organomegaly or mass.  EXTREMITIES: No pedal edema, cyanosis, or clubbing.  NEUROLOGIC: Cranial nerves II through XII are intact. Muscle strength 5/5 in all extremities. Sensation intact. Gait not checked.  PSYCHIATRIC: The patient is alert and oriented x 3.  SKIN: No obvious rash, lesion, or ulcer.   LABORATORY PANEL:   CBC  Recent Labs Lab 11/03/15 1230  WBC 5.1  HGB 14.3  HCT 42.5  PLT 246   ------------------------------------------------------------------------------------------------------------------  Chemistries   Recent Labs Lab 11/03/15 1230  NA 138  K 3.7  CL 105  CO2 27  GLUCOSE 102*  BUN <5*  CREATININE 0.70  CALCIUM 8.9   ------------------------------------------------------------------------------------------------------------------  Cardiac Enzymes  Recent Labs Lab 11/03/15 1230  TROPONINI 0.49*   ------------------------------------------------------------------------------------------------------------------  RADIOLOGY:  Ct Angio Chest/abd/pel For Dissection W And/or Wo Contrast  Result Date: 11/03/2015 CLINICAL DATA:  25 year old female with foot and leg pain. Back pain. EXAM: CT ANGIOGRAPHY CHEST, ABDOMEN AND PELVIS TECHNIQUE: Multidetector CT imaging through the chest, abdomen and pelvis was performed using the  standard protocol during bolus administration of intravenous contrast. Contrast was administered after noncontrast chest CT. Multiplanar reconstructed images and MIPs were obtained and reviewed to evaluate the vascular anatomy. CONTRAST:  100 cc Isovue 370 COMPARISON:  None. FINDINGS: CTA CHEST FINDINGS Vascular: Unremarkable course caliber and contour of the thoracic aorta. Note that motion artifact at the aortic root limits the sensitivity of the examination for subtle abnormality. No periaortic fluid. No aneurysm. No dissection flap. Three vessel arch with patency of the branch vessels. Unremarkable appearance of the descending thoracic aorta. Noncontrast study demonstrates no evidence of intramural hematoma. The timing of the contrast bolus limits the sensitivity for the detection of pulmonary emboli, however, there are no central or lobar filling defects identified. Heart size unremarkable.  No pericardial fluid/ thickening. Nonvascular: Unremarkable appearance of the superficial soft tissues of the chest. Unremarkable thoracic inlet. No mediastinal adenopathy. Unremarkable course of the thoracic esophagus. No confluent airspace disease, pneumothorax, or pleural effusion. No displaced fracture. Review of the MIP images confirms the above findings. CTA ABDOMEN AND PELVIS FINDINGS Vascular: Unremarkable course caliber and contour of the abdominal aorta. No aneurysm, dissection flap, or periaortic fluid. No significant atherosclerotic disease. Mesenteric vessels: Celiac artery, superior mesenteric artery, and inferior mesenteric artery are patent. Renal arteries: Bilateral renal arteries are patent. Right lower extremity: Unremarkable course caliber and contour of  the right iliac system. No aneurysm, dissection, occlusion, or significant atherosclerosis. Hypogastric artery is patent. Common femoral artery patent. Proximal profunda femoris and SFA are patent. Left lower extremity: Unremarkable course caliber and  contour of the left iliac system. No aneurysm, dissection, occlusion, or significant atherosclerosis. Hypogastric artery is patent. Common femoral artery patent. Proximal profunda femoris and SFA are patent. Nonvascular: Cranial caudal span of the right liver lobe measures greater than 20 cm. Unremarkable gallbladder. Borderline enlarged spleen with the greatest diameter 12.8 cm. Unremarkable bilateral adrenal glands. Unremarkable bilateral kidneys. Unremarkable course the bilateral ureters. Unremarkable urinary bladder. Unremarkable appearance of the uterus and adnexa. Unremarkable appearance of the pancreas. Unremarkable appearance of stomach, small bowel, and colon. No evidence of inflammatory changes. Surgical changes of appendectomy. No intra abdominal fluid or lymph nodes. No displaced fracture. Patient has multiple partially calcified posterior disc complex/protrusion of the lumbar spine with associated ligamentum flavum thickening. These changes were better characterized on MRI 10/09/2015. Review of the MIP images confirms the above findings. IMPRESSION: No CT evidence of acute aortic syndrome. No acute finding to account for the patient's left leg and back pain. Note that the patient has multiple lumbar disc protrusion, which were better characterized on recent MRI 10/09/2015. Hepatomegaly and borderline splenomegaly. Correlation with lab values may be useful if there is concern for myeloproliferative disorder. Signed, Yvone NeuJaime S. Loreta AveWagner, DO [facility] Electronically Signed   By: Gilmer MorJaime  Wagner D.O.   On: 11/03/2015 15:04      IMPRESSION AND PLAN:    NSTEMI, cocaine induced, The patient will be admitted to telemetry floor, follow-up troponin level, start heparin drip and aspirin. Cardiology consult. No beta blocker due to cocaine abuse.  Cocaine abuse. Cessation was counseled.   All the records are reviewed and case discussed with ED  provider. Management plans discussed with the patient, family and they are in agreement.  CODE STATUS: full code.  TOTAL TIME TAKING CARE OF THIS PATIENT: 55 minutes.    Shaune Pollackhen, Tammy Hickman M.D on 11/03/2015 at 3:43 PM  Between 7am to 6pm - Pager - [phone]  After 6pm go to [email or web address] - Social research [email or web address], [email or web address] [email or web address]  [facility] [place] [facility]  Office  [phone]  CC: Primary care physician; Fidel LevyJames Hawkins Jr, MD   Note: This dictation was prepared with Dragon dictation along with smaller phrase technology. Any transcriptional errors that result from this process are unintentional.

## 2015-11-03 NOTE — ED Notes (Signed)
Dr. Mayford KnifeWilliams notified of critical trop of 0.49

## 2015-11-03 NOTE — ED Provider Notes (Signed)
[facility] Emergency Department Provider Note        Time seen: ----------------------------------------- 1:32 PM on 11/03/2015 -----------------------------------------    I have reviewed the triage vital signs and the nursing notes.   HISTORY  Chief Complaint Leg Swelling and Back Pain    HPI Tammy Hickman is a 25 y.o. female who presents to ER for bilateral leg edema for 4 days. She denies any injury, states she feels fatigued for the same period time, denies chest pain but does note from time to time feels like she has trouble breathing. Patient was seen a month ago for knee leg and back pain about a month ago. She denies recent illness, denies fevers chills or other complaints.   Past Medical History:  Diagnosis Date  . Anxiety   . Asthma   . Bipolar disorder (HCC)   . Hernia, umbilical 2011  . Mental disorders of mother, antepartum   . Obesity complicating pregnancy, childbirth, or the puerperium, antepartum condition or complication(649.13)   . Other specified indication for care or intervention related to labor and delivery, antepartum   . Thyroid dysfunction     Patient Active Problem List   Diagnosis Date Noted  . Low back pain 01/13/2015  . Leg pain, right 01/13/2015  . Opiate overdose 10/31/2014  . Dysthymia 10/31/2014  . ADHD (attention deficit hyperactivity disorder) 10/31/2014  . Obesity 07/27/2011  . Contraception, generic surveillance 07/27/2011    Past Surgical History:  Procedure Laterality Date  . ABDOMINAL SURGERY    . HERNIA REPAIR     umbilical hernia  . TONSILLECTOMY  2009    Allergies Fentanyl; Meloxicam; Penicillins; Prednisone; and Hydromorphone  Social History Social History  Substance Use Topics  . Smoking status: Light Tobacco Smoker    Packs/day: 0.20    Last attempt to quit: 11/14/2009  . Smokeless tobacco: Never Used  . Alcohol use No    Review of Systems Constitutional: Negative for  fever. Cardiovascular: Negative for chest pain. Respiratory: Positive shortness of breath Gastrointestinal: Negative for abdominal pain, vomiting and diarrhea. Genitourinary: Negative for dysuria. Musculoskeletal: Positive for back pain, leg pain Skin: Negative for rash. Neurological: Negative for headaches, focal weakness or numbness.  10-point ROS otherwise negative.  ____________________________________________   PHYSICAL EXAM:  VITAL SIGNS: ED Triage Vitals  Enc Vitals Group     BP 11/03/15 1207 115/76     Pulse Rate 11/03/15 1207 (!) 108     Resp 11/03/15 1207 18     Temp 11/03/15 1207 98.9 F (37.2 C)     Temp Source 11/03/15 1207 Oral     SpO2 11/03/15 1207 97 %     Weight 11/03/15 1154 240 lb (108.9 kg)     Height 11/03/15 1154 5\' 8"  (1.727 m)     Head Circumference --      Peak Flow --      Pain Score 11/03/15 1154 8     Pain Loc --      Pain Edu? --      Excl. in GC? --     Constitutional: Alert and oriented. Well appearing and in no distress. Eyes: Conjunctivae are normal. PERRL. Normal extraocular movements. ENT   Head: Normocephalic and atraumatic.   Nose: No congestion/rhinnorhea.   Mouth/Throat: Mucous membranes are moist.   Neck: No stridor. Cardiovascular: Normal rate, regular rhythm. No murmurs, rubs, or gallops. Respiratory: Normal respiratory effort without tachypnea nor retractions. Breath sounds are clear and equal bilaterally. No wheezes/rales/rhonchi.  Gastrointestinal: Soft and nontender. Normal bowel sounds Musculoskeletal: Nontender with normal range of motion in all extremities. No lower extremity tenderness nor edema. Neurologic:  Normal speech and language. No gross focal neurologic deficits are appreciated.  Skin:  Skin is warm, dry and intact. No rash noted. Psychiatric: Mood and affect are normal. Speech and behavior are normal.  ____________________________________________  EKG: Interpreted by me.Sinus rhythm rate of 99  bpm, normal PR interval, normal QRS, normal QT interval. Normal axis.  ____________________________________________  ED COURSE:  Pertinent labs & imaging results that were available during my care of the patient were reviewed by me and considered in my medical decision making (see chart for details). Clinical Course  Patient presents to ER in no acute distress, unclear etiology for symptoms. We will check basic labs and imaging.  Troponin markedly elevated of uncertain etiology. I will place her on heparin, aspirin and perform CT imaging of her chest.  Procedures ____________________________________________   LABS (pertinent positives/negatives)  Labs Reviewed  CBC - Abnormal; Notable for the following:       Result Value   RDW 15.0 (*)    All other components within normal limits  TROPONIN I - Abnormal; Notable for the following:    Troponin I 0.49 (*)    All other components within normal limits  BASIC METABOLIC PANEL - Abnormal; Notable for the following:    Glucose, Bld 102 (*)    BUN <5 (*)    All other components within normal limits  URINE DRUG SCREEN, QUALITATIVE ([facility] ONLY) - Abnormal; Notable for the following:    Cocaine Metabolite,Ur [place] POSITIVE (*)    Cannabinoid 50 Ng, Ur [place] POSITIVE (*)    Benzodiazepine, Ur [place] POSITIVE (*)    All other components within normal limits  BRAIN NATRIURETIC PEPTIDE  PROTIME-INR  APTT  HEPARIN LEVEL (UNFRACTIONATED)    RADIOLOGY Images were viewed by me  CT angiogram of the chest IMPRESSION: No CT evidence of acute aortic syndrome.  No acute finding to account for the patient's left leg and back pain. Note that the patient has multiple lumbar disc protrusion, which were better characterized on recent MRI 10/09/2015.  Hepatomegaly and borderline splenomegaly. Correlation with lab values may be useful if there is concern for myeloproliferative disorder. ____________________________________________  FINAL ASSESSMENT  AND PLAN  Dyspnea, elevated troponin, Polysubstance abuse  Plan: Patient with labs and imaging as dictated above. No clear etiology for her elevated troponin. Her back pain is likely related to lumbar disc problems. Cocaine and the drug screen is likely the cause for her elevated troponin. No big PE was visualized. I will discuss with the hospitals for admission.   Emily Filbert, MD   Note: This dictation was prepared with Dragon dictation. Any transcriptional errors that result from this process are unintentional    Emily Filbert, MD 11/03/15 [phone]

## 2015-11-03 NOTE — ED Triage Notes (Signed)
States has had bilateral leg edema x 4 days, denies injury, fatigued for same time. Denies chest pain or SOB.

## 2015-11-03 NOTE — ED Notes (Signed)
Pt given meal trey 

## 2015-11-03 NOTE — Plan of Care (Signed)
Problem: Pain Managment: Goal: General experience of comfort will improve Outcome: Not Progressing Pt complaining that medication is not working for her. Pt seems to be comfortably resting in the bed with her cell phone.

## 2015-11-03 NOTE — Progress Notes (Signed)
Dr. Imogene BurnHen paged to make aware of pt c/o back and leg pain

## 2015-11-04 ENCOUNTER — Inpatient Hospital Stay (HOSPITAL_COMMUNITY)
Admit: 2015-11-04 | Discharge: 2015-11-04 | Disposition: A | Discharge status: Home / Self Care | Payer: Self-pay | Attending: Student | Admitting: Student

## 2015-11-04 ENCOUNTER — Encounter: Payer: Self-pay | Admitting: Student

## 2015-11-04 DIAGNOSIS — R7989 Other specified abnormal findings of blood chemistry: Secondary | ICD-10-CM

## 2015-11-04 DIAGNOSIS — R778 Other specified abnormalities of plasma proteins: Secondary | ICD-10-CM

## 2015-11-04 DIAGNOSIS — M7989 Other specified soft tissue disorders: Secondary | ICD-10-CM

## 2015-11-04 DIAGNOSIS — F1410 Cocaine abuse, uncomplicated: Secondary | ICD-10-CM

## 2015-11-04 DIAGNOSIS — M545 Low back pain: Secondary | ICD-10-CM

## 2015-11-04 DIAGNOSIS — E669 Obesity, unspecified: Secondary | ICD-10-CM

## 2015-11-04 LAB — CBC
HEMATOCRIT: 37.5 % (ref 35.0–47.0)
HEMOGLOBIN: 12.8 g/dL (ref 12.0–16.0)
MCH: 29.5 pg (ref 26.0–34.0)
MCHC: 34.1 g/dL (ref 32.0–36.0)
MCV: 86.5 fL (ref 80.0–100.0)
Platelets: 230 10*3/uL (ref 150–440)
RBC: 4.34 MIL/uL (ref 3.80–5.20)
RDW: 15.2 % — AB (ref 11.5–14.5)
WBC: 5.3 10*3/uL (ref 3.6–11.0)

## 2015-11-04 LAB — LIPID PANEL
CHOL/HDL RATIO: 4.3 ratio
CHOLESTEROL: 90 mg/dL (ref 0–200)
HDL: 21 mg/dL — AB (ref >40)
LDL Cholesterol: 55 mg/dL (ref 0–99)
TRIGLYCERIDES: 68 mg/dL (ref <150)
VLDL: 14 mg/dL (ref 0–40)

## 2015-11-04 LAB — ECHOCARDIOGRAM COMPLETE
Height: 68 in
WEIGHTICAEL: 4182.4 [oz_av]

## 2015-11-04 LAB — HEPARIN LEVEL (UNFRACTIONATED): HEPARIN UNFRACTIONATED: 0.16 [IU]/mL — AB (ref 0.30–0.70)

## 2015-11-04 LAB — CK: Total CK: 63 U/L (ref 38–234)

## 2015-11-04 MED ORDER — CYCLOBENZAPRINE HCL 10 MG PO TABS: 10.0000 mg | ORAL_TABLET | Freq: Three times a day (TID) | ORAL | 0 refills | Status: DC | PRN

## 2015-11-04 MED ORDER — IBUPROFEN 400 MG PO TABS: 400.0000 mg | ORAL_TABLET | Freq: Four times a day (QID) | ORAL | Status: DC | PRN

## 2015-11-04 NOTE — Consult Note (Signed)
Cardiology Consult    Patient ID: Tammy Hickman MRN: [id number], DOB/AGE: 16-Jul-1990   Admit date: 11/03/2015 Date of Consult: 11/04/2015  Primary Physician: Fidel Levy, MD Reason for Consult: Elevated Troponin Primary Cardiologist: New - Dr. Mariah Milling Requesting Provider: Dr. Elpidio Anis   History of Present Illness    Tammy Hickman is a 25 y.o. female with past medical history of asthma, Bipolar disorder, and substance abuse (Cocaine) who presented to [facility] on 11/03/2015 for left leg knee and leg pain along with back pain.   The patient reports she had been actively helping a friend with household chores over the past several days and noticed swelling in her lower extremities. She also notes having paraesthesias along her feet and left 4th and 5th digits at times. Says this has occurred in the past but is increasing in frequency and severity.   She denies any recent chest discomfort or palpitations. Says she is active in taking care of her 38-year old son and denies any dyspnea with exertion. No symptoms while admitted.  She does report a family history of CAD with her maternal aunt having a fatal MI in her late-30's. Maternal grandmother, grandfather, and mother all with CAD. She denies any personal history of CAD, HTN, HLD, or Type 2 DM. Does report using Cocaine last week, saying this is not a "regular event". Also reports smoking 2-3 cigarettes per day. Denies any excessive alcohol use.   Labs show a WBC of 5.1, Hgb 14.3, and platelets 246. K+ 3.7. Creatinine 0.70. BNP 61. TSH 1.140. Lipid Panel with total cholesterol 90, HDL 21, and LDL 55. Cyclic troponin values have been 0.49, 0.48, and 0.45. UDS positive for Cocaine, THC, and Benzodiazepines. CTA without evidence of aortic dissection. Multiple lumbar disc protrusions noted along with hepatomegaly and borderline splenomegaly. EKG shows NSR, HR 99, with no acute ST or T-wave changes.   Past Medical History   Past  Medical History:  Diagnosis Date  . Anxiety   . Asthma   . Bipolar disorder (HCC)   . Hernia, umbilical 2011  . Mental disorders of mother, antepartum   . Obesity complicating pregnancy, childbirth, or the puerperium, antepartum condition or complication(649.13)   . Other specified indication for care or intervention related to labor and delivery, antepartum   . Thyroid dysfunction     Past Surgical History:  Procedure Laterality Date  . ABDOMINAL SURGERY    . HERNIA REPAIR     umbilical hernia  . TONSILLECTOMY  2009     Allergies  Allergies  Allergen Reactions  . Fentanyl Hives  . Meloxicam Other (See Comments)    Knees locked up  . Penicillins Other (See Comments)    Chest pains  . Prednisone     Unsure of reaction  . Hydromorphone Hives and Rash    States had redness around IV site during IV admin. States given benadryl and went away.    Inpatient Medications    . aspirin EC  81 mg Oral Daily  . atorvastatin  40 mg Oral q1800    Family History    Family History  Problem Relation Age of Onset  . Cancer Paternal Grandfather     Esophageal  . Heart disease Paternal Grandmother   . Diabetes Maternal Grandmother   . Heart disease Maternal Grandfather   . Diabetes Maternal Grandfather   . Cancer Maternal Grandfather     Esophageal  . Heart disease Maternal Aunt     Social  History    Social History   Social History  . Marital status: Single    Spouse name: N/A  . Number of children: N/A  . Years of education: N/A   Occupational History  . Not on file.   Social History Main Topics  . Smoking status: Light Tobacco Smoker    Packs/day: 0.20    Last attempt to quit: 11/14/2009  . Smokeless tobacco: Never Used  . Alcohol use No  . Drug use:      Comment: Pain pills  . Sexual activity: Not Currently    Partners: Male    Birth control/ protection: Condom   Other Topics Concern  . Not on file   Social History Narrative  . No narrative on file       Review of Systems    General:  No chills, fever, night sweats or weight changes. Positive for back pain. Cardiovascular:  No chest pain, dyspnea on exertion, edema, orthopnea, palpitations, paroxysmal nocturnal dyspnea. Dermatological: No rash, lesions/masses Respiratory: No cough, dyspnea Urologic: No hematuria, dysuria Abdominal:   No nausea, vomiting, diarrhea, bright red blood per rectum, melena, or hematemesis Neurologic:  No visual changes, wkns, changes in mental status. Positive for numbness and tingling in lower extremities.  All other systems reviewed and are otherwise negative except as noted above.  Physical Exam    Blood pressure 118/61, pulse 73, temperature 97.9 F (36.6 C), temperature source Oral, resp. rate 16, height 5\' 8"  (1.727 m), weight 261 lb 6.4 oz (118.6 kg), last menstrual period 10/24/2015, SpO2 98 %.  General: Pleasant, Caucasian female appearing in NAD. Psych: Normal affect. Neuro: Alert and oriented X 3. Moves all extremities spontaneously. HEENT: Normal  Neck: Supple without bruits or JVD. Lungs:  Resp regular and unlabored, CTA without wheezing or rales. Heart: RRR no s3, s4, or murmurs. Abdomen: Soft, non-tender, non-distended, BS + x 4.  Extremities: No clubbing, cyanosis or edema. DP/PT/Radials 2+ and equal bilaterally. Sensation in tact. Strength 5/5 in upper and lower extremities.   Labs    Troponin (Point of Care Test) No results for input(s): TROPIPOC in the last 72 hours.  Recent Labs  11/03/15 1230 11/03/15 1727 11/03/15 2126  TROPONINI 0.49* 0.48* 0.45*   Lab Results  Component Value Date   WBC 5.3 11/04/2015   HGB 12.8 11/04/2015   HCT 37.5 11/04/2015   MCV 86.5 11/04/2015   PLT 230 11/04/2015    Recent Labs Lab 11/03/15 1230  NA 138  K 3.7  CL 105  CO2 27  BUN <5*  CREATININE 0.70  CALCIUM 8.9  GLUCOSE 102*   Lab Results  Component Value Date   CHOL 90 11/04/2015   HDL 21 (L) 11/04/2015   LDLCALC 55  11/04/2015   TRIG 68 11/04/2015   No results found for: [facility]   Radiology Studies    Mr Lumbar Spine Wo Contrast  Result Date: 10/09/2015 CLINICAL DATA:  Low back pain, right leg heaviness and numbness for 2 weeks, worsening today. EXAM: MRI LUMBAR SPINE WITHOUT CONTRAST TECHNIQUE: Multiplanar, multisequence MR imaging of the lumbar spine was performed. No intravenous contrast was administered. Due to excessive motion, contrast was not administered and, thoracic spine MRI was not performed. COMPARISON:  CT abdomen and pelvis May 05, 2015. FINDINGS: All sequences are moderately or severely motion degraded. SEGMENTATION: For the purposes of this report, the last well-formed intervertebral disc will be described as L5-S1. ALIGNMENT: No malalignment.  Maintenance of the lumbar lordosis. VERTEBRAE:Lumbar vertebral  bodies are intact. Mild L2-3, L3-4 and L5-S1 disc height loss better seen on prior CT with decreased T2 signal within all disc compatible with mild desiccation. No definite bone marrow signal abnormality to suggest acute osseous process. CONUS MEDULLARIS: Conus medullaris terminates at L1-2 and demonstrates normal morphology and signal characteristics. Cauda equina obscured by patient motion. PARASPINAL AND SOFT TISSUES: Included prevertebral and paraspinal soft tissues are normal. DISC LEVELS: (severely motion degraded axial T1 sequences, moderate to severely motion degraded axial T2 sequences. T12-L1: No disc bulge, canal stenosis nor neural foraminal narrowing. L1-2: Moderate central disc protrusion. Mild canal stenosis without neural foraminal narrowing. L2-3: Large 9 mm broad-based disc osteophyte complex results in severe canal stenosis. Minimal RIGHT neural foraminal narrowing. L3-4: Moderate to large LEFT central to subarticular 6 mm disc protrusion at base of LEFT lateral recess which may affect the traversing LEFT L4 nerve. Moderate canal stenosis minimal bilateral neural foraminal  narrowing. L4-5: Small RIGHT central disc protrusion and annular fissure. Mild suspected canal stenosis with encroachment upon the traversing RIGHT L5 nerve. Minimal RIGHT neural foraminal narrowing. L5-S1: Moderate LEFT central 5 mm disc protrusion versus extrusion with annular fissure. Mild facet arthropathy without canal stenosis. Mild LEFT neural foraminal narrowing. IMPRESSION: Moderate to severely motion degraded examination. Contrast not administered. No acute fracture or malalignment. Advanced degenerative change for age. Severe canal stenosis L2-3, moderate at L3-4 and mild L4-5. Minimal to mild L2-3 through L5-S1 neural foraminal narrowing. Electronically Signed   By: Awilda Metro M.D.   On: 10/09/2015 20:40   Mr Thoracic Spine W Wo Contrast  Result Date: 10/09/2015 : Nondiagnostic examination. The patient was unable to tolerate imaging. Only single localizer sequence was obtained. Examination is nondiagnostic and no report is provided. Electronically Signed   By: Burman Nieves M.D.   On: 10/09/2015 22:40   Ct Angio Chest/abd/pel For Dissection W And/or Wo Contrast  Result Date: 11/03/2015 CLINICAL DATA:  25 year old female with foot and leg pain. Back pain. EXAM: CT ANGIOGRAPHY CHEST, ABDOMEN AND PELVIS TECHNIQUE: Multidetector CT imaging through the chest, abdomen and pelvis was performed using the standard protocol during bolus administration of intravenous contrast. Contrast was administered after noncontrast chest CT. Multiplanar reconstructed images and MIPs were obtained and reviewed to evaluate the vascular anatomy. CONTRAST:  100 cc Isovue 370 COMPARISON:  None. FINDINGS: CTA CHEST FINDINGS Vascular: Unremarkable course caliber and contour of the thoracic aorta. Note that motion artifact at the aortic root limits the sensitivity of the examination for subtle abnormality. No periaortic fluid. No aneurysm. No dissection flap. Three vessel arch with patency of the branch vessels.  Unremarkable appearance of the descending thoracic aorta. Noncontrast study demonstrates no evidence of intramural hematoma. The timing of the contrast bolus limits the sensitivity for the detection of pulmonary emboli, however, there are no central or lobar filling defects identified. Heart size unremarkable.  No pericardial fluid/ thickening. Nonvascular: Unremarkable appearance of the superficial soft tissues of the chest. Unremarkable thoracic inlet. No mediastinal adenopathy. Unremarkable course of the thoracic esophagus. No confluent airspace disease, pneumothorax, or pleural effusion. No displaced fracture. Review of the MIP images confirms the above findings. CTA ABDOMEN AND PELVIS FINDINGS Vascular: Unremarkable course caliber and contour of the abdominal aorta. No aneurysm, dissection flap, or periaortic fluid. No significant atherosclerotic disease. Mesenteric vessels: Celiac artery, superior mesenteric artery, and inferior mesenteric artery are patent. Renal arteries: Bilateral renal arteries are patent. Right lower extremity: Unremarkable course caliber and contour of the right iliac system. No  aneurysm, dissection, occlusion, or significant atherosclerosis. Hypogastric artery is patent. Common femoral artery patent. Proximal profunda femoris and SFA are patent. Left lower extremity: Unremarkable course caliber and contour of the left iliac system. No aneurysm, dissection, occlusion, or significant atherosclerosis. Hypogastric artery is patent. Common femoral artery patent. Proximal profunda femoris and SFA are patent. Nonvascular: Cranial caudal span of the right liver lobe measures greater than 20 cm. Unremarkable gallbladder. Borderline enlarged spleen with the greatest diameter 12.8 cm. Unremarkable bilateral adrenal glands. Unremarkable bilateral kidneys. Unremarkable course the bilateral ureters. Unremarkable urinary bladder. Unremarkable appearance of the uterus and adnexa. Unremarkable  appearance of the pancreas. Unremarkable appearance of stomach, small bowel, and colon. No evidence of inflammatory changes. Surgical changes of appendectomy. No intra abdominal fluid or lymph nodes. No displaced fracture. Patient has multiple partially calcified posterior disc complex/protrusion of the lumbar spine with associated ligamentum flavum thickening. These changes were better characterized on MRI 10/09/2015. Review of the MIP images confirms the above findings. IMPRESSION: No CT evidence of acute aortic syndrome. No acute finding to account for the patient's left leg and back pain. Note that the patient has multiple lumbar disc protrusion, which were better characterized on recent MRI 10/09/2015. Hepatomegaly and borderline splenomegaly. Correlation with lab values may be useful if there is concern for myeloproliferative disorder. Signed, Yvone Neu. Loreta Ave, DO [facility] Radiology Electronically Signed   By: Gilmer Mor D.O.   On: 11/03/2015 15:04    EKG & Cardiac Imaging    EKG:  NSR, HR 99, with no acute ST or T-weave changes.   Echocardiogram: Pending  Assessment & Plan    1. Elevated Troponin - presented for evaluation of lower extremity and low back pain. She denies any recent chest pain or dyspnea with exertion. CTA without evidence of aortic dissection.  - does have family history of premature CAD with maternal aunt having fatal MI in her late-30's. - no personal history of HTN, HLD, or Type 2 DM. Does report using Cocaine last week.  - Cyclic troponin values have been 0.49, 0.48, and 0.45. EKG shows NSR, HR 99, with no acute ST or T-weave changes. UDS positive for Cocaine, THC, and Benzodiazepines.  - elevated troponin likely secondary to cocaine induced vasospasm. Will obtain echocardiogram to check LV function and wall motion. If normal, would not pursue further invasive evaluation at this time.   2. Lower Extremity Edema -  patient reports having lower extremity edema for the past several days after being more active than normal last week.  - edema resolved on exam today. BNP 61.0. Echo pending but this does not seem consistent with CHF.   3. Paraesthesias - reports numbness and tingling along upper and lower extremities, occurring spontaneously.  - MRI on 8/17 showed advanced degenerative change for age with severe canal stenosis L2-3, moderate at L3-4 and mild L4-5, and minimal to mild L2-3 through L5-S1 neural foraminal narrowing. - strength and sensation in tact on physical examination. - per admitting team  4. Substance Use - UDS positive for Cocaine, THC, and Benzodiazepines. Also reports smoking 2-3 cigarettes per day. - cessation advised.    Signed, Ellsworth Lennox, PA-C 11/04/2015, 7:51 AM Pager: [phone]

## 2015-11-04 NOTE — Progress Notes (Signed)
*  PRELIMINARY RESULTS* Echocardiogram 2D Echocardiogram has been performed.  Cristela BlueHege, Sayid Moll 11/04/2015, 9:15 AM

## 2015-11-04 NOTE — Care Management (Signed)
Patient with known substance and admitted with cocaine induced nstemi.  Cardiology consult is pending.  CSW referral for substance abuse.

## 2015-11-04 NOTE — Progress Notes (Signed)
Patient being discharged to home.  Friends will drive her.  Instructions given regarding back pain, and back exercises.  Stressed that she needs to follow up with Neuro.  New prescription for Flexeril for her back pain as alternative to narcotics.

## 2015-11-05 NOTE — Discharge Summary (Signed)
[facility] [place] at [facility]   PATIENT NAME: Tammy KitchensBrittany Hickman    MR#:  [id number]  DATE OF BIRTH:  [birth year]  DATE OF ADMISSION:  11/03/2015 ADMITTING PHYSICIAN: Shaune PollackQing Chen, MD  DATE OF DISCHARGE: 11/04/2015  2:10 PM  PRIMARY CARE PHYSICIAN: Fidel LevyJames Hawkins Jr, MD   ADMISSION DIAGNOSIS:  Cocaine abuse [F14.10] Elevated troponin I level [R79.89] Low back pain, unspecified back pain laterality, with sciatica presence unspecified [M54.5]  DISCHARGE DIAGNOSIS:  Active Problems:   NSTEMI (non-ST elevated myocardial infarction) (HCC)   Cocaine abuse   Elevated troponin I level   Leg swelling   SECONDARY DIAGNOSIS:   Past Medical History:  Diagnosis Date  . Anxiety   . Asthma   . Bipolar disorder (HCC)   . Hernia, umbilical 2011  . Mental disorders of mother, antepartum   . Obesity complicating pregnancy, childbirth, or the puerperium, antepartum condition or complication(649.13)   . Other specified indication for care or intervention related to labor and delivery, antepartum   . Thyroid dysfunction      ADMITTING HISTORY  Tammy Hickman  is a 25 y.o. female with a known history of Bipolar disorder, anxiety and asthma. The patient presents to the ED due to back pain today. The patient denies any chest pain, palpitation or orthopnea. But has leg edema for 4 days. Patient was noticed to have elevated troponin at 0.49. CT angiogram didn't show any PE or dissection. Patient used cocaine 1 week ago. Urine toxicology showed positive cocaine.  HOSPITAL COURSE:   * Elevated troponin likely due to cocaine use Patient was seen by cardiology. Echocardiogram checked which showed normal ejection fraction. Normal motion abnormalities. Cleared for discharge. Counseled to quit cocaine.  *  Lower extremity tingling and weakness This has been a chronic problem. Patient is waiting for her follow-up with Dr. Wynetta Emerycram of neuro[place] in [place].  Patient is  stable for discharge to follow up with primary care physician and neurosurgery.  Flexeril prescription given  CONSULTS OBTAINED:  Treatment Team:  Antonieta Ibaimothy J Gollan, MD  DRUG ALLERGIES:   Allergies  Allergen Reactions  . Fentanyl Hives  . Meloxicam Other (See Comments)    Knees locked up  . Penicillins Other (See Comments)    Chest pains  . Prednisone     Unsure of reaction  . Hydromorphone Hives and Rash    States had redness around IV site during IV admin. States given benadryl and went away.    DISCHARGE MEDICATIONS:   Discharge Medication List as of 11/04/2015 12:16 PM    CONTINUE these medications which have CHANGED   Details  cyclobenzaprine (FLEXERIL) 10 MG tablet Take 1 tablet (10 mg total) by mouth 3 (three) times daily as needed for muscle spasms., Starting Tue 11/04/2015, Print      CONTINUE these medications which have NOT CHANGED   Details  acetaminophen (TYLENOL) 325 MG tablet Take 2 tablets (650 mg total) by mouth every 6 (six) hours as needed for moderate pain., Starting Thu 10/09/2015, Print    clonazePAM (KLONOPIN) 1 MG tablet Take 1 mg by mouth 3 (three) times daily., Historical Med        Today   VITAL SIGNS:  Blood pressure 138/87, pulse 89, temperature 98.6 F (37 C), temperature source Oral, resp. rate 16, height 5\' 8"  (1.727 m), weight 118.6 kg (261 lb 6.4 oz), last menstrual period 10/24/2015, SpO2 97 %.  I/O:  No intake or output data in the 24 hours ending  11/05/15 1434  PHYSICAL EXAMINATION:  Physical Exam  GENERAL:  25 y.o.-year-old patient lying in the bed with no acute distress.  LUNGS: Normal breath sounds bilaterally, no wheezing, rales,rhonchi or crepitation. No use of accessory muscles of respiration.  CARDIOVASCULAR: S1, S2 normal. No murmurs, rubs, or gallops.  ABDOMEN: Soft, non-tender, non-distended. Bowel sounds present. No organomegaly or mass.  NEUROLOGIC: Moves all 4 extremities. PSYCHIATRIC: The patient is alert and  oriented x 3.  SKIN: No obvious rash, lesion, or ulcer.   DATA REVIEW:   CBC  Recent Labs Lab 11/04/15 0638  WBC 5.3  HGB 12.8  HCT 37.5  PLT 230    Chemistries   Recent Labs Lab 11/03/15 1230 11/03/15 1727  NA 138  --   K 3.7  --   CL 105  --   CO2 27  --   GLUCOSE 102*  --   BUN <5*  --   CREATININE 0.70  --   CALCIUM 8.9  --   MG  --  2.2    Cardiac Enzymes  Recent Labs Lab 11/03/15 2126  TROPONINI 0.45*    Microbiology Results  Results for orders placed or performed in visit on 09/14/12  Urine culture     Status: None   Collection Time: 09/14/12  6:20 PM  Result Value Ref Range Status   Micro Text Report   Final       SOURCE: CLEAN CATCH    ORGANISM 1                1000 CFU/ML STREPTOCOCCUS AGALACTIAE(GROUP B)   COMMENT                   WITH MIXED-BACTERIAL-ORGANISMS   ANTIBIOTIC                                                        RADIOLOGY:  No results found.  Follow up with PCP in 1 week.  Management plans discussed with the patient, family and they are in agreement.  CODE STATUS:  Code Status History    Date Active Date Inactive Code Status Order ID Comments User Context   11/03/2015  5:14 PM 11/04/2015  8:32 AM Full Code [id number]  Shaune Pollack, MD ED      TOTAL TIME TAKING CARE OF THIS PATIENT ON DAY OF DISCHARGE: more than 30 minutes.   Milagros Loll R M.D on 11/05/2015 at 2:34 PM  Between 7am to 6pm - Pager - [phone]  After 6pm go to [email or web address] - password [email or web address] [facility]  [facility]  Office  [phone]  CC: Primary care physician; Fidel Levy, MD  Note: This dictation was prepared with Dragon dictation along with smaller phrase technology. Any transcriptional errors that result from this process are unintentional.

## 2015-12-08 ENCOUNTER — Inpatient Hospital Stay
Admission: EM | Admit: 2015-12-08 | Discharge: 2015-12-09 | DRG: 872 | Disposition: A | Discharge status: Home / Self Care | Payer: Medicaid Other | Attending: Internal Medicine | Admitting: Internal Medicine

## 2015-12-08 ENCOUNTER — Emergency Department: Payer: Medicaid Other

## 2015-12-08 ENCOUNTER — Encounter: Payer: Self-pay | Admitting: Emergency Medicine

## 2015-12-08 DIAGNOSIS — I252 Old myocardial infarction: Secondary | ICD-10-CM

## 2015-12-08 DIAGNOSIS — F1410 Cocaine abuse, uncomplicated: Secondary | ICD-10-CM | POA: Diagnosis present

## 2015-12-08 DIAGNOSIS — Z6839 Body mass index (BMI) 39.0-39.9, adult: Secondary | ICD-10-CM

## 2015-12-08 DIAGNOSIS — E669 Obesity, unspecified: Secondary | ICD-10-CM | POA: Diagnosis present

## 2015-12-08 DIAGNOSIS — A419 Sepsis, unspecified organism: Principal | ICD-10-CM | POA: Diagnosis present

## 2015-12-08 DIAGNOSIS — F419 Anxiety disorder, unspecified: Secondary | ICD-10-CM | POA: Diagnosis present

## 2015-12-08 DIAGNOSIS — F319 Bipolar disorder, unspecified: Secondary | ICD-10-CM | POA: Diagnosis present

## 2015-12-08 DIAGNOSIS — Z79899 Other long term (current) drug therapy: Secondary | ICD-10-CM

## 2015-12-08 DIAGNOSIS — N12 Tubulo-interstitial nephritis, not specified as acute or chronic: Secondary | ICD-10-CM

## 2015-12-08 DIAGNOSIS — N10 Acute tubulo-interstitial nephritis: Secondary | ICD-10-CM | POA: Diagnosis present

## 2015-12-08 DIAGNOSIS — F17210 Nicotine dependence, cigarettes, uncomplicated: Secondary | ICD-10-CM | POA: Diagnosis present

## 2015-12-08 DIAGNOSIS — E876 Hypokalemia: Secondary | ICD-10-CM | POA: Diagnosis present

## 2015-12-08 LAB — CBC WITH DIFFERENTIAL/PLATELET
BASOS ABS: 0 10*3/uL (ref 0–0.1)
Basophils Relative: 0 %
EOS PCT: 1 %
Eosinophils Absolute: 0.1 10*3/uL (ref 0–0.7)
HCT: 45.2 % (ref 35.0–47.0)
Hemoglobin: 15.1 g/dL (ref 12.0–16.0)
LYMPHS PCT: 21 %
Lymphs Abs: 2.2 10*3/uL (ref 1.0–3.6)
MCH: 29.4 pg (ref 26.0–34.0)
MCHC: 33.3 g/dL (ref 32.0–36.0)
MCV: 88.4 fL (ref 80.0–100.0)
Monocytes Absolute: 0.7 10*3/uL (ref 0.2–0.9)
Monocytes Relative: 7 %
NEUTROS PCT: 71 %
Neutro Abs: 7.5 10*3/uL — ABNORMAL HIGH (ref 1.4–6.5)
PLATELETS: 237 10*3/uL (ref 150–440)
RBC: 5.11 MIL/uL (ref 3.80–5.20)
RDW: 17.1 % — ABNORMAL HIGH (ref 11.5–14.5)
WBC: 10.6 10*3/uL (ref 3.6–11.0)

## 2015-12-08 LAB — URINALYSIS COMPLETE WITH MICROSCOPIC (ARMC ONLY)
Bilirubin Urine: NEGATIVE
GLUCOSE, UA: NEGATIVE mg/dL
KETONES UR: NEGATIVE mg/dL
NITRITE: NEGATIVE
Protein, ur: 30 mg/dL — AB
SPECIFIC GRAVITY, URINE: 1.015 (ref 1.005–1.030)
pH: 5 (ref 5.0–8.0)

## 2015-12-08 LAB — URINE DRUG SCREEN, QUALITATIVE (ARMC ONLY)
AMPHETAMINES, UR SCREEN: NOT DETECTED
Barbiturates, Ur Screen: NOT DETECTED
Benzodiazepine, Ur Scrn: NOT DETECTED
Cannabinoid 50 Ng, Ur ~~LOC~~: NOT DETECTED
Cocaine Metabolite,Ur ~~LOC~~: POSITIVE — AB
MDMA (ECSTASY) UR SCREEN: NOT DETECTED
Methadone Scn, Ur: NOT DETECTED
OPIATE, UR SCREEN: NOT DETECTED
PHENCYCLIDINE (PCP) UR S: NOT DETECTED
Tricyclic, Ur Screen: NOT DETECTED

## 2015-12-08 LAB — TSH: TSH: 2.121 u[IU]/mL (ref 0.350–4.500)

## 2015-12-08 LAB — COMPREHENSIVE METABOLIC PANEL
ALT: 27 U/L (ref 14–54)
ANION GAP: 7 (ref 5–15)
AST: 30 U/L (ref 15–41)
Albumin: 3.8 g/dL (ref 3.5–5.0)
Alkaline Phosphatase: 61 U/L (ref 38–126)
BILIRUBIN TOTAL: 0.8 mg/dL (ref 0.3–1.2)
BUN: 10 mg/dL (ref 6–20)
CO2: 29 mmol/L (ref 22–32)
Calcium: 9 mg/dL (ref 8.9–10.3)
Chloride: 100 mmol/L — ABNORMAL LOW (ref 101–111)
Creatinine, Ser: 0.86 mg/dL (ref 0.44–1.00)
Glucose, Bld: 98 mg/dL (ref 65–99)
POTASSIUM: 3.4 mmol/L — AB (ref 3.5–5.1)
Sodium: 136 mmol/L (ref 135–145)
TOTAL PROTEIN: 7.8 g/dL (ref 6.5–8.1)

## 2015-12-08 LAB — LACTIC ACID, PLASMA: LACTIC ACID, VENOUS: 0.8 mmol/L (ref 0.5–1.9)

## 2015-12-08 LAB — PREGNANCY, URINE: Preg Test, Ur: NEGATIVE

## 2015-12-08 LAB — POCT PREGNANCY, URINE: Preg Test, Ur: NEGATIVE

## 2015-12-08 MED ORDER — SODIUM CHLORIDE 0.9 % IV BOLUS (SEPSIS)
1000.0000 mL | Freq: Once | INTRAVENOUS | Status: AC
  Administered 2015-12-08: 1000 mL via INTRAVENOUS

## 2015-12-08 MED ORDER — CEFTRIAXONE SODIUM-DEXTROSE 1-3.74 GM-% IV SOLR
INTRAVENOUS | Status: AC
  Administered 2015-12-08: 1000 mg via INTRAVENOUS
  Filled 2015-12-08: qty 50

## 2015-12-08 MED ORDER — CLONAZEPAM 1 MG PO TABS
1.0000 mg | ORAL_TABLET | Freq: Two times a day (BID) | ORAL | Status: DC
  Administered 2015-12-08: 1 mg via ORAL
  Filled 2015-12-08: qty 1

## 2015-12-08 MED ORDER — ONDANSETRON HCL 4 MG/2ML IJ SOLN: 4.0000 mg | Freq: Four times a day (QID) | INTRAMUSCULAR | Status: DC | PRN

## 2015-12-08 MED ORDER — MORPHINE SULFATE (PF) 2 MG/ML IV SOLN
1.0000 mg | INTRAVENOUS | Status: DC | PRN
  Administered 2015-12-08 (×2): 2 mg via INTRAVENOUS
  Administered 2015-12-08: 1 mg via INTRAVENOUS
  Administered 2015-12-08 – 2015-12-09 (×3): 2 mg via INTRAVENOUS
  Filled 2015-12-08 (×6): qty 1

## 2015-12-08 MED ORDER — POTASSIUM CHLORIDE CRYS ER 20 MEQ PO TBCR
40.0000 meq | EXTENDED_RELEASE_TABLET | Freq: Once | ORAL | Status: AC
  Administered 2015-12-08: 40 meq via ORAL
  Filled 2015-12-08: qty 2

## 2015-12-08 MED ORDER — ONDANSETRON HCL 4 MG PO TABS: 4.0000 mg | ORAL_TABLET | Freq: Four times a day (QID) | ORAL | Status: DC | PRN

## 2015-12-08 MED ORDER — BISACODYL 5 MG PO TBEC
5.0000 mg | DELAYED_RELEASE_TABLET | Freq: Every day | ORAL | Status: DC | PRN
Start: 2015-12-08 — End: 2015-12-09

## 2015-12-08 MED ORDER — SODIUM CHLORIDE 0.9 % IV SOLN
INTRAVENOUS | Status: DC
  Administered 2015-12-08 – 2015-12-09 (×2): via INTRAVENOUS

## 2015-12-08 MED ORDER — ACETAMINOPHEN 650 MG RE SUPP: 650.0000 mg | Freq: Four times a day (QID) | RECTAL | Status: DC | PRN

## 2015-12-08 MED ORDER — CEFTRIAXONE SODIUM-DEXTROSE 1-3.74 GM-% IV SOLR
1.0000 g | INTRAVENOUS | Status: DC
  Filled 2015-12-08: qty 50

## 2015-12-08 MED ORDER — LORAZEPAM 2 MG/ML IJ SOLN: 0.5000 mg | Freq: Once | INTRAMUSCULAR | Status: DC

## 2015-12-08 MED ORDER — DEXTROSE 5 % IV SOLN: 1.0000 g | Freq: Once | INTRAVENOUS | Status: DC

## 2015-12-08 MED ORDER — ACETAMINOPHEN 325 MG PO TABS
650.0000 mg | ORAL_TABLET | Freq: Four times a day (QID) | ORAL | Status: DC | PRN
  Administered 2015-12-08: 650 mg via ORAL
  Filled 2015-12-08: qty 2

## 2015-12-08 MED ORDER — SENNOSIDES-DOCUSATE SODIUM 8.6-50 MG PO TABS: 1.0000 | ORAL_TABLET | Freq: Every evening | ORAL | Status: DC | PRN

## 2015-12-08 MED ORDER — INFLUENZA VAC SPLIT QUAD 0.5 ML IM SUSY: 0.5000 mL | PREFILLED_SYRINGE | INTRAMUSCULAR | Status: DC

## 2015-12-08 MED ORDER — MAGNESIUM CITRATE PO SOLN
1.0000 | Freq: Once | ORAL | Status: DC | PRN
  Filled 2015-12-08: qty 296

## 2015-12-08 NOTE — Progress Notes (Signed)
[facility] [place] at [facility]   PATIENT NAME: Tammy Hickman    MR#:  [id number]  DATE OF BIRTH:  1990/04/14  SUBJECTIVE:   Patient here with pyelonephritis  REVIEW OF SYSTEMS:    Review of Systems  Constitutional: Negative.  Negative for chills, fever and malaise/fatigue.  HENT: Negative.  Negative for ear discharge, ear pain, hearing loss, nosebleeds and sore throat.   Eyes: Negative.  Negative for blurred vision and pain.  Respiratory: Negative.  Negative for cough, hemoptysis, shortness of breath and wheezing.   Cardiovascular: Negative.  Negative for chest pain, palpitations and leg swelling.  Gastrointestinal: Negative.  Negative for abdominal pain, blood in stool, diarrhea, nausea and vomiting.  Genitourinary: Positive for flank pain, frequency and urgency. Negative for dysuria.  Musculoskeletal: Negative for back pain.  Skin: Negative.   Neurological: Negative for dizziness, tremors, speech change, focal weakness, seizures and headaches.  Endo/Heme/Allergies: Negative.  Does not bruise/bleed easily.  Psychiatric/Behavioral: Negative.  Negative for depression, hallucinations and suicidal ideas.    Tolerating Diet:ys      DRUG ALLERGIES:   Allergies  Allergen Reactions  . Fentanyl Hives  . Meloxicam Other (See Comments)    Knees locked up  . Penicillins Other (See Comments)    Chest pains  . Prednisone     Unsure of reaction  . Hydromorphone Hives and Rash    States had redness around IV site during IV admin. States given benadryl and went away.    VITALS:  Blood pressure 127/61, pulse (!) 126, temperature 100.2 F (37.9 C), temperature source Oral, resp. rate 19, height 5\' 8"  (1.727 m), weight 119 kg (262 lb 6.4 oz), last menstrual period 11/23/2015, SpO2 97 %.  PHYSICAL EXAMINATION:   Physical Exam    LABORATORY PANEL:   CBC  Recent Labs Lab 12/08/15 0202  WBC 10.6  HGB 15.1  HCT 45.2  PLT 237    ------------------------------------------------------------------------------------------------------------------  Chemistries   Recent Labs Lab 12/08/15 0202  NA 136  K 3.4*  CL 100*  CO2 29  GLUCOSE 98  BUN 10  CREATININE 0.86  CALCIUM 9.0  AST 30  ALT 27  ALKPHOS 61  BILITOT 0.8   ------------------------------------------------------------------------------------------------------------------  Cardiac Enzymes No results for input(s): TROPONINI in the last 168 hours. ------------------------------------------------------------------------------------------------------------------  RADIOLOGY:  Ct Renal Stone Study  Result Date: 12/08/2015 CLINICAL DATA:  25 year old female with flank pain. Evaluate for stone. EXAM: CT ABDOMEN AND PELVIS WITHOUT CONTRAST TECHNIQUE: Multidetector CT imaging of the abdomen and pelvis was performed following the standard protocol without IV contrast. COMPARISON:  CT dated 05/05/2015 FINDINGS: Evaluation of this exam is limited in the absence of intravenous contrast. Lower chest: Bibasilar linear atelectasis/ scarring. The lung bases are otherwise clear. No intra-abdominal free air.  No free fluid. Hepatobiliary: No focal liver abnormality is seen. No gallstones, gallbladder wall thickening, or biliary dilatation. Pancreas: Unremarkable. No pancreatic ductal dilatation or surrounding inflammatory changes. Spleen: Normal in size without focal abnormality. Adrenals/Urinary Tract: Adrenal glands are unremarkable. Kidneys are normal, without renal calculi, focal lesion, or hydronephrosis. Bladder is unremarkable. Stomach/Bowel: There is moderate stool throughout the colon. There is a small cecal diverticulum without active inflammatory changes. No evidence of bowel obstruction or active inflammation. Appendectomy Vascular/Lymphatic: The abdominal aorta and IVC are grossly unremarkable on noncontrast study. No portal venous gas identified. There is no  adenopathy. Reproductive: The uterus and ovaries are grossly unremarkable. Other: There is mild subcutaneous stranding of the lower abdomen as well  as stranding of the lower lumbar subcutaneous fat. No fluid collection. Musculoskeletal: No acute or significant osseous findings. IMPRESSION: No acute intra-abdominal or pelvic pathology. No hydronephrosis or nephrolithiasis. Constipation. No evidence of bowel obstruction or active inflammation. The small cecal diverticulum without active inflammatory changes. Electronically Signed   By: Elgie CollardArash  Radparvar M.D.   On: 12/08/2015 05:37     ASSESSMENT AND PLAN:   25 year old female with a history of anxiety who was admitted with sepsis due to acute pyelonephritis  1. Sepsis due to pyelonephritis: Continue IV Rocephin and follow up on blood and urine cultures.  2. Polynephritis: Continue IV Rocephin and follow up on cultures  3. History of cocaine abuse: Continue telemetry monitoring  4. History of anxiety: Continue Klonopin      Management plans discussed with the patient and she is in agreement.  CODE STATUS: full  TOTAL TIME TAKING CARE OF THIS PATIENT: 30 minutes.     POSSIBLE D/C tomorrow, DEPENDING ON CLINICAL CONDITION.   Ty Buntrock M.D on 12/08/2015 at 12:14 PM  Between 7am to 6pm - Pager - [phone] After 6pm go to [email or web address] - password [email or web address]  [facility] [place] [facility]  Office  [phone]  CC: Primary care physician; Fidel LevyJames Hawkins Jr, MD  Note: This dictation was prepared with Dragon dictation along with smaller phrase technology. Any transcriptional errors that result from this process are unintentional.

## 2015-12-08 NOTE — ED Triage Notes (Signed)
Per EMS pt came from home and called out due to back pain.  En route they found she has a temp of 101.9.  Patient is rousable if you keep her on task but EMS said they had to use ammonia to rouse her.  Pt denies any drug use but appears to have track marks on her arms.  She states she has burning back pains and intermittently moans and talks about it.

## 2015-12-08 NOTE — ED Notes (Signed)
MD at bedside, patient is crying about her pain.

## 2015-12-08 NOTE — ED Notes (Signed)
[place] to [place], pt to floor with tech. Room 218.

## 2015-12-08 NOTE — Progress Notes (Signed)
Pharmacy Antibiotic Note  Tammy Hickman is a 25 y.o. female admitted on 12/08/2015 with pyelonephritis  Pharmacy has been consulted for ceftriaxone dosing.  Plan: Ceftriaxone 1gm IV Q24H  Height: 5\' 8"  (172.7 cm) Weight: 262 lb 6.4 oz (119 kg) IBW/kg (Calculated) : 63.9  Temp (24hrs), Avg:100 F (37.8 C), Min:99.7 F (37.6 C), Max:100.2 F (37.9 C)   Recent Labs Lab 12/08/15 0202  WBC 10.6  CREATININE 0.86  LATICACIDVEN 0.8    Estimated Creatinine Clearance: 136.8 mL/min (by C-G formula based on SCr of 0.86 mg/dL).    Allergies  Allergen Reactions  . Fentanyl Hives  . Meloxicam Other (See Comments)    Knees locked up  . Penicillins Other (See Comments)    Chest pains  . Prednisone     Unsure of reaction  . Hydromorphone Hives and Rash    States had redness around IV site during IV admin. States given benadryl and went away.    Antimicrobials this admission:  Microbiology results: 10/16 Blood x 2: pend 10/16 Urine: pend  Thank you for allowing pharmacy to be a part of this patient's care.  Atilano Covelli C 12/08/2015 9:34 AM

## 2015-12-08 NOTE — ED Notes (Signed)
Admitting MD at bedside.

## 2015-12-08 NOTE — ED Notes (Signed)
Consulted pharmacy bc scanned foil pack 1g Rocephin does not match up in Epic with 1g Rocephine in the Pyxis, they said "it was a billing issue" but it is the same medication.  Informing charge of the issue.

## 2015-12-08 NOTE — ED Notes (Signed)
Ambulated patient to toilet 

## 2015-12-08 NOTE — Progress Notes (Signed)
Chaplain visited this Pt to provide spiritual support, but the Pt was asleep. Chaplain plans to visit the Pt when he makes rounds on the the unit again soon.    12/08/15 1700  Clinical Encounter Type  Visited With Patient  Visit Type Initial  Spiritual Encounters  Spiritual Needs Prayer

## 2015-12-08 NOTE — ED Provider Notes (Signed)
[facility] Emergency Department Provider Note   ____________________________________________   First MD Initiated Contact with Patient 12/08/15 0157     (approximate)  I have reviewed the triage vital signs and the nursing notes.   HISTORY  Chief Complaint Fever    HPI Tammy Hickman is a 25 y.o. female comes into the hospital today with back pain and fever. She reports at home she had a temperature of 101.9 with body aches. The patient is very somnolent and according to EMS they had to use an ammonia help wake her up so she could answer questions. The patient has a history of substance abuse but denies any drug use today. She reports that her back pain started a few days ago. She reports that it was burning so she fell asleep. She reports though she woke up and she was still hurting so she came in. The patient reports that she took nothing for pain. She is having some difficulty staying awake here in the emergency department. She keeps falling asleep during the history. The patient has no belly pain and is unable to tell me what side of her back hurts.   Past Medical History:  Diagnosis Date  . Anxiety   . Asthma   . Bipolar disorder (HCC)   . Hernia, umbilical 2011  . Mental disorders of mother, antepartum   . Obesity complicating pregnancy, childbirth, or the puerperium, antepartum condition or complication(649.13)   . Other specified indication for care or intervention related to labor and delivery, antepartum   . Thyroid dysfunction     Patient Active Problem List   Diagnosis Date Noted  . Pyelonephritis 12/08/2015  . Cocaine abuse   . Elevated troponin I level   . Leg swelling   . NSTEMI (non-ST elevated myocardial infarction) (HCC) 11/03/2015  . Low back pain 01/13/2015  . Leg pain, right 01/13/2015  . Opiate overdose 10/31/2014  . Dysthymia 10/31/2014  . ADHD (attention deficit hyperactivity disorder) 10/31/2014  . Obesity  07/27/2011  . Contraception, generic surveillance 07/27/2011    Past Surgical History:  Procedure Laterality Date  . ABDOMINAL SURGERY    . HERNIA REPAIR     umbilical hernia  . TONSILLECTOMY  2009    Prior to Admission medications   Medication Sig Start Date End Date Taking? Authorizing Provider  acetaminophen (TYLENOL) 325 MG tablet Take 2 tablets (650 mg total) by mouth every 6 (six) hours as needed for moderate pain. 10/09/15   Jami L Hagler, PA-C  clonazePAM (KLONOPIN) 1 MG tablet Take 1 mg by mouth 3 (three) times daily.    Historical Provider, MD  cyclobenzaprine (FLEXERIL) 10 MG tablet Take 1 tablet (10 mg total) by mouth 3 (three) times daily as needed for muscle spasms. 11/04/15   Milagros Loll, MD    Allergies Fentanyl; Meloxicam; Penicillins; Prednisone; and Hydromorphone  Family History  Problem Relation Age of Onset  . Cancer Paternal Grandfather     Esophageal  . Heart disease Paternal Grandmother   . Diabetes Maternal Grandmother   . Heart disease Maternal Grandfather   . Diabetes Maternal Grandfather   . Cancer Maternal Grandfather     Esophageal  . Heart disease Maternal Aunt     Fatal MI in late-30's    Social History Social History  Substance Use Topics  . Smoking status: Light Tobacco Smoker    Packs/day: 0.20    Last attempt to quit: 11/14/2009  . Smokeless tobacco: Never Used  .  Alcohol use No    Review of Systems Constitutional:  fever/chills Eyes: No visual changes. ENT: No sore throat. Cardiovascular: Denies chest pain. Respiratory: Denies shortness of breath. Gastrointestinal: No abdominal pain.  No nausea, no vomiting.  No diarrhea.  No constipation. Genitourinary: Negative for dysuria. Musculoskeletal:  back pain. Skin: Negative for rash. Neurological: Negative for headaches, focal weakness or numbness.  10-point ROS otherwise negative.  ____________________________________________   PHYSICAL EXAM:  VITAL SIGNS: ED Triage  Vitals  Enc Vitals Group     BP 12/08/15 0137 (!) 146/114     Pulse Rate 12/08/15 0137 (!) 126     Resp 12/08/15 0137 16     Temp 12/08/15 0137 99.7 F (37.6 C)     Temp Source 12/08/15 0137 Oral     SpO2 12/08/15 0137 95 %     Weight 12/08/15 0139 271 lb 1.6 oz (123 kg)     Height 12/08/15 0139 5\' 8"  (1.727 m)     Head Circumference --      Peak Flow --      Pain Score 12/08/15 0139 10     Pain Loc --      Pain Edu? --      Excl. in GC? --     Constitutional: Somnolent but arousable. Moderate distress. Eyes: Conjunctivae are normal. PERRL. EOMI. Head: Atraumatic. Nose: No congestion/rhinnorhea. Mouth/Throat: Mucous membranes are moist.  Oropharynx non-erythematous. Cardiovascular: Normal rate, regular rhythm. Grossly normal heart sounds.  Good peripheral circulation. Respiratory: Normal respiratory effort.  No retractions. Lungs CTAB. Gastrointestinal: Soft and nontender. No distention. Positive bowel sounds unable to elicit distinct CVA tenderness to palpation Musculoskeletal: No lower extremity tenderness nor edema.   Neurologic:  Normal speech and language.  Skin:  Skin is warm, dry and intact. Marland Kitchen Psychiatric: Mood and affect are normal.   ____________________________________________   LABS (all labs ordered are listed, but only abnormal results are displayed)  Labs Reviewed  COMPREHENSIVE METABOLIC PANEL - Abnormal; Notable for the following:       Result Value   Potassium 3.4 (*)    Chloride 100 (*)    All other components within normal limits  CBC WITH DIFFERENTIAL/PLATELET - Abnormal; Notable for the following:    RDW 17.1 (*)    Neutro Abs 7.5 (*)    All other components within normal limits  URINALYSIS COMPLETEWITH MICROSCOPIC ([facility] ONLY) - Abnormal; Notable for the following:    Color, Urine YELLOW (*)    APPearance CLOUDY (*)    Hgb urine dipstick 1+ (*)    Protein, ur 30 (*)    Leukocytes, UA 3+ (*)    Bacteria, UA MANY (*)    Squamous Epithelial /  LPF 6-30 (*)    All other components within normal limits  URINE DRUG SCREEN, QUALITATIVE ([facility] ONLY) - Abnormal; Notable for the following:    Cocaine Metabolite,Ur [place] POSITIVE (*)    All other components within normal limits  URINE CULTURE  CULTURE, BLOOD (ROUTINE X 2)  CULTURE, BLOOD (ROUTINE X 2)  LACTIC ACID, PLASMA  PREGNANCY, URINE  POCT PREGNANCY, URINE   ____________________________________________  EKG  none ____________________________________________  RADIOLOGY  CT renal stone study ____________________________________________   PROCEDURES  Procedure(s) performed: None  Procedures  Critical Care performed: Yes, see critical care note(s)  CRITICAL CARE Performed by: Tammy Hickman   Total critical care time: 30 minutes  Critical care time was exclusive of separately billable procedures and treating other patients.  Critical  care was necessary to treat or prevent imminent or life-threatening deterioration.  Critical care was time spent personally by me on the following activities: development of treatment plan with patient and/or surrogate as well as nursing, discussions with consultants, evaluation of patient's response to treatment, examination of patient, obtaining history from patient or surrogate, ordering and performing treatments and interventions, ordering and review of laboratory studies, ordering and review of radiographic studies, pulse oximetry and re-evaluation of patient's condition.  ____________________________________________   INITIAL IMPRESSION / ASSESSMENT AND PLAN / ED COURSE  Pertinent labs & imaging results that were available during my care of the patient were reviewed by me and considered in my medical decision making (see chart for details).  This is a 25 year old female who was brought in by EMS with fever and back pain. When the patient arrived here though she was afebrile. The patient was significantly tachycardic. I  gave the patient a liter of normal saline initially and she woke up screaming that she was in pain but fell back asleep. We did check the patient's blood and urine and it appears that the patient does have a UTI with a concern for pyelonephritis given her fever and her back pain. I will give the patient a second liter of normal saline and she still tachycardic and give her some ceftriaxone. I will do a CT scan to ensure that the patient does not have a kidney stone.  Clinical Course  Value Comment By Time  CT Renal Stone Study No acute intra-abdominal or pelvic pathology. No hydronephrosis or nephrolithiasis.  Constipation. No evidence of bowel obstruction or active inflammation.  The small cecal diverticulum without active inflammatory changes.   Rebecka ApleyAllison Hickman Arely Tinner, MD 10/16 [phone]    The patient's CT scan does not show a kidney stone. As the patient remains tachycardic I will admit the patient to the hospital for sepsis. The patient's lactic acid is normal and her blood pressure still unremarkable. The patient has received some antibiotics and she will be admitted. There is a possibility for the patient's tachycardia is due to the cocaine in her urine. She otherwise has no further complaints or concerns.  ED Sepsis - Repeat Assessment   Performed at:    0620 03/10/15  Last Vitals:    Blood pressure 136/70, pulse (!) 130, temperature 99.7 F (37.6 C), temperature source Oral, resp. rate (!) 22, height 5\' 8"  (1.727 m), weight 271 lb 1.6 oz (123 kg), last menstrual period 11/23/2015, SpO2 98 %.  Heart:                 Tachycardia  Lungs:     CTAB  Capillary Refill:   <2 sec  Peripheral Pulse (include location): 2+ radial   Skin (include color):   Warm, dry, normal color  ____________________________________________   FINAL CLINICAL IMPRESSION(S) / ED DIAGNOSES  Final diagnoses:  Pyelonephritis  Sepsis, due to unspecified organism [facility](HCC)      NEW MEDICATIONS STARTED DURING  THIS VISIT:  New Prescriptions   No medications on file     Note:  This document was prepared using Dragon voice recognition software and may include unintentional dictation errors.    Rebecka ApleyAllison Hickman Reco Shonk, MD 12/08/15 [phone]

## 2015-12-08 NOTE — Care Management Note (Addendum)
Case Management Note  Patient Details  Name: Tammy Hickman MRN: [id number] Date of Birth: Mar 04, 1990  Subjective/Objective:      25yo [name] was admitted with sepsis from pyelonephritis. During this assessment [name] was writhing around in her bed in obvious discomfort. Febrile today. This Clinical research associatewriter updated her nurse that [name] appears to be in pain. [name] Medicaid benefits ran out last July. [name] reports that she did not reapply for Medicaid, no reason given. This writer called Asa LenteCarol Overby [phone] [facility] [employer] and [name] reported that someone from [employer] would see [name] about re-applying for Medicaid. [name] was unable to provide any financial information at this time about her yearly income so that she can be qualified [facility]    PCP=Dr Venora MaplesJames Hawkins. Pharmacy[facility] [place]. Resides with a "roommate". [name] reports that 'friends" provide transportation. No home oxygen, no home assistive equipment, no home health services. Case management will follow for discharge planning.           Action/Plan:   Expected Discharge Date:                  Expected Discharge Plan:     In-House Referral:     Discharge planning Services     Post Acute Care Choice:    Choice offered to:     DME Arranged:    DME Agency:     HH Arranged:    HH Agency:     Status of Service:     If discussed at [employer] Length of Stay Meetings, dates discussed:    Additional Comments:  Ebbie Cherry A, RN 12/08/2015, 11:29 AM

## 2015-12-08 NOTE — ED Notes (Signed)
Patient returned from CT

## 2015-12-08 NOTE — ED Notes (Signed)
Patient transported to CT 

## 2015-12-08 NOTE — H&P (Addendum)
[facility] - [place] @ [facility] Admission History and Physical [employer], D.O.  ---------------------------------------------------------------------------------------------------------------------   PATIENT NAME: Tammy Hickman MR#: [id number] DATE OF BIRTH: Mar 23, 1990 DATE OF ADMISSION: 12/08/2015 PRIMARY CARE PHYSICIAN: Fidel Levy, MD  REQUESTING/REFERRING PHYSICIAN: ED Dr. Zenda Alpers  CHIEF COMPLAINT: Chief Complaint  Patient presents with  . Fever    HISTORY OF PRESENT ILLNESS: Tammy Hickman is a 25 y.o. female with a known history of Anxiety, bipolar disorder, hypothyroidism and asthma presents to the emergency department complaining of left low back pain.  Patient was in a usual state of health until 3 days ago when she developed left low back pain associated with fever to a maximum 11.9 at home. She reports fatigue, generalized weakness, sweating. She has not taken any medication or sought any medical attention for her symptoms..  Otherwise there has been no change in status. Patient has been taking medication as prescribed and there has been no recent change in medication or diet.  There has been no recent illness, travel or sick contacts.    Patient denies dizziness, chest pain, shortness of breath, N/V/C/D, abdominal pain, dysuria/frequency, changes in mental status.   EMS/ED COURSE:   Patient received Rocephin IV fluids.  PAST MEDICAL HISTORY: Past Medical History:  Diagnosis Date  . Anxiety   . Asthma   . Bipolar disorder (HCC)   . Hernia, umbilical 2011  . Mental disorders of mother, antepartum   . Obesity complicating pregnancy, childbirth, or the puerperium, antepartum condition or complication(649.13)   . Other specified indication for care or intervention related to labor and delivery, antepartum   . Thyroid dysfunction       PAST SURGICAL HISTORY: Past Surgical History:  Procedure Laterality Date  . ABDOMINAL SURGERY    .  HERNIA REPAIR     umbilical hernia  . TONSILLECTOMY  2009      SOCIAL HISTORY: Social History  Substance Use Topics  . Smoking status: Light Tobacco Smoker    Packs/day: 0.20    Last attempt to quit: 11/14/2009  . Smokeless tobacco: Never Used  . Alcohol use No      FAMILY HISTORY: Family History  Problem Relation Age of Onset  . Cancer Paternal Grandfather     Esophageal  . Heart disease Paternal Grandmother   . Diabetes Maternal Grandmother   . Heart disease Maternal Grandfather   . Diabetes Maternal Grandfather   . Cancer Maternal Grandfather     Esophageal  . Heart disease Maternal Aunt     Fatal MI in late-30's     MEDICATIONS AT HOME: Prior to Admission medications   Medication Sig Start Date End Date Taking? Authorizing Provider  acetaminophen (TYLENOL) 325 MG tablet Take 2 tablets (650 mg total) by mouth every 6 (six) hours as needed for moderate pain. 10/09/15   Jami L Hagler, PA-C  clonazePAM (KLONOPIN) 1 MG tablet Take 1 mg by mouth 3 (three) times daily.    Historical Provider, MD  cyclobenzaprine (FLEXERIL) 10 MG tablet Take 1 tablet (10 mg total) by mouth 3 (three) times daily as needed for muscle spasms. 11/04/15   Milagros Loll, MD      DRUG ALLERGIES: Allergies  Allergen Reactions  . Fentanyl Hives  . Meloxicam Other (See Comments)    Knees locked up  . Penicillins Other (See Comments)    Chest pains  . Prednisone     Unsure of reaction  . Hydromorphone Hives and Rash    States had redness  around IV site during IV admin. States given benadryl and went away.     REVIEW OF SYSTEMS: CONSTITUTIONAL:Positive fatigue, weakness, fever, negative chills, weight gain/loss, headache EYES: No blurry or double vision. ENT: No tinnitus, postnasal drip, redness or soreness of the oropharynx. RESPIRATORY: No dyspnea, cough, wheeze, hemoptysis. CARDIOVASCULAR: No chest pain, orthopnea, palpitations, syncope. GASTROINTESTINAL: No nausea, vomiting,  constipation, diarrhea, abdominal pain. No hematemesis, melena or hematochezia. GENITOURINARY: No dysuria, frequency, hematuria. ENDOCRINE: No polyuria or nocturia. No heat or cold intolerance. HEMATOLOGY: No anemia, bruising, bleeding. INTEGUMENTARY: No rashes, ulcers, lesions. MUSCULOSKELETAL: No pain, arthritis, swelling, gout.Positive back pain NEUROLOGIC: No numbness, tingling, weakness or ataxia. No seizure-type activity. PSYCHIATRIC: No anxiety, depression, insomnia.  PHYSICAL EXAMINATION: VITAL SIGNS: Blood pressure (!) 108/59, pulse (!) 126, temperature 99.7 F (37.6 C), temperature source Oral, resp. rate 20, height 5\' 8"  (1.727 m), weight 123 kg (271 lb 1.6 oz), last menstrual period 11/23/2015, SpO2 99 %.  GENERAL: 25 y.o.-year-old white female patient, well-developed, well-nourished lying in the bed in no acute distress.  Restless and somnolent. HEENT: Head atraumatic, normocephalic. Pupils equal, round, reactive to light and accommodation. No scleral icterus. Extraocular muscles intact. Oropharynx is clear. Mucus membranes moist. NECK: Supple, full range of motion. No JVD, no bruit heard. No cervical lymphadenopathy. CHEST: Normal breath sounds bilaterally. No wheezing, rales, rhonchi or crackles. No use of accessory muscles of respiration.  No reproducible chest wall tenderness.  CARDIOVASCULAR: S1, S2 normal. No murmurs, rubs, or gallops appreciated. Cap refill <2 seconds. ABDOMEN: Soft, nontender, nondistended. No rebound, guarding, rigidity. Normoactive bowel sounds present in all four quadrants. No organomegaly or mass. Positive left-sided CVA tenderness EXTREMITIES: Full range of motion. No pedal edema, cyanosis, or clubbing. NEUROLOGIC: Cranial nerves II through XII are grossly intact with no focal sensorimotor deficit. Muscle strength 5/5 in all extremities. Sensation intact. Gait not checked. PSYCHIATRIC: The patient is alert and oriented x 3. Normal affect, mood, thought  content. SKIN: Warm, dry, and intact without obvious rash, lesion, or ulcer.  LABORATORY PANEL:  CBC  Recent Labs Lab 12/08/15 0202  WBC 10.6  HGB 15.1  HCT 45.2  PLT 237   ----------------------------------------------------------------------------------------------------------------- Chemistries  Recent Labs Lab 12/08/15 0202  NA 136  K 3.4*  CL 100*  CO2 29  GLUCOSE 98  BUN 10  CREATININE 0.86  CALCIUM 9.0  AST 30  ALT 27  ALKPHOS 61  BILITOT 0.8   ------------------------------------------------------------------------------------------------------------------ Cardiac Enzymes No results for input(s): TROPONINI in the last 168 hours. ------------------------------------------------------------------------------------------------------------------  RADIOLOGY: Ct Renal Stone Study  Result Date: 12/08/2015 CLINICAL DATA:  25 year old female with flank pain. Evaluate for stone. EXAM: CT ABDOMEN AND PELVIS WITHOUT CONTRAST TECHNIQUE: Multidetector CT imaging of the abdomen and pelvis was performed following the standard protocol without IV contrast. COMPARISON:  CT dated 05/05/2015 FINDINGS: Evaluation of this exam is limited in the absence of intravenous contrast. Lower chest: Bibasilar linear atelectasis/ scarring. The lung bases are otherwise clear. No intra-abdominal free air.  No free fluid. Hepatobiliary: No focal liver abnormality is seen. No gallstones, gallbladder wall thickening, or biliary dilatation. Pancreas: Unremarkable. No pancreatic ductal dilatation or surrounding inflammatory changes. Spleen: Normal in size without focal abnormality. Adrenals/Urinary Tract: Adrenal glands are unremarkable. Kidneys are normal, without renal calculi, focal lesion, or hydronephrosis. Bladder is unremarkable. Stomach/Bowel: There is moderate stool throughout the colon. There is a small cecal diverticulum without active inflammatory changes. No evidence of bowel obstruction or  active inflammation. Appendectomy Vascular/Lymphatic: The abdominal aorta and IVC  are grossly unremarkable on noncontrast study. No portal venous gas identified. There is no adenopathy. Reproductive: The uterus and ovaries are grossly unremarkable. Other: There is mild subcutaneous stranding of the lower abdomen as well as stranding of the lower lumbar subcutaneous fat. No fluid collection. Musculoskeletal: No acute or significant osseous findings. IMPRESSION: No acute intra-abdominal or pelvic pathology. No hydronephrosis or nephrolithiasis. Constipation. No evidence of bowel obstruction or active inflammation. The small cecal diverticulum without active inflammatory changes. Electronically Signed   By: Elgie Collard M.D.   On: 12/08/2015 05:37    IMPRESSION AND PLAN:  This is a 25 y.o. female with a history of Anxiety, bipolar disorder, hypothyroidism and asthma now being admitted with: 1. Sepsis secondary to pyelonephritis-admit for IV antibiotics with Rocephin IV fluids follow blood and urine cultures, pain control, antipyretics. 2. Hypokalemia-replace by mouth 3. History of anxiety-continue Klonopin 4. History of cocaine abuse-monitor on telemetry   Diet/Nutrition: Regular Fluids: Normal saline DVT Px:, SCDs and early ambulation Code Status: Full  All the records are reviewed and case discussed with ED provider. Management plans discussed with the patient and/or family who express understanding and agree with plan of care.   TOTAL TIME TAKING CARE OF THIS PATIENT: 50 minutes.   Dionicio Shelnutt D.O. on 12/08/2015 at 7:29 AM Between 7am to 6pm - Pager - [phone] After 6pm go to [email or web address] - Social research officer, government [facility] [place] [facility] Office [phone] CC: Primary care physician; Fidel Levy, MD     Note: This dictation was prepared with Dragon dictation along with smaller phrase technology. Any transcriptional errors that result from this process  are unintentional.

## 2015-12-09 LAB — CBC
HCT: 40.7 % (ref 35.0–47.0)
HEMOGLOBIN: 13.3 g/dL (ref 12.0–16.0)
MCH: 28.9 pg (ref 26.0–34.0)
MCHC: 32.6 g/dL (ref 32.0–36.0)
MCV: 88.7 fL (ref 80.0–100.0)
PLATELETS: 179 10*3/uL (ref 150–440)
RBC: 4.59 MIL/uL (ref 3.80–5.20)
RDW: 16.9 % — ABNORMAL HIGH (ref 11.5–14.5)
WBC: 6.7 10*3/uL (ref 3.6–11.0)

## 2015-12-09 LAB — BASIC METABOLIC PANEL
ANION GAP: 6 (ref 5–15)
BUN: 10 mg/dL (ref 6–20)
CALCIUM: 8.7 mg/dL — AB (ref 8.9–10.3)
CO2: 24 mmol/L (ref 22–32)
CREATININE: 0.5 mg/dL (ref 0.44–1.00)
Chloride: 108 mmol/L (ref 101–111)
GLUCOSE: 94 mg/dL (ref 65–99)
Potassium: 4.3 mmol/L (ref 3.5–5.1)
Sodium: 138 mmol/L (ref 135–145)

## 2015-12-09 MED ORDER — CEFUROXIME AXETIL 500 MG PO TABS: 500.0000 mg | ORAL_TABLET | Freq: Two times a day (BID) | ORAL | 0 refills | Status: DC

## 2015-12-09 MED ORDER — CLONAZEPAM 1 MG PO TABS: 1.0000 mg | ORAL_TABLET | Freq: Two times a day (BID) | ORAL | 0 refills | Status: DC

## 2015-12-09 NOTE — Progress Notes (Signed)
12/09/2015  10:45  Lowanda FosterBrittany A Arington to be D/C'd Home per MD order.  Discussed prescriptions and follow up appointments with the patient. Prescriptions given to patient, medication list explained in detail. Pt verbalized understanding.    Medication List    TAKE these medications   acetaminophen 325 MG tablet Commonly known as:  TYLENOL Take 2 tablets (650 mg total) by mouth every 6 (six) hours as needed for moderate pain.   amphetamine-dextroamphetamine 15 MG 24 hr capsule Commonly known as:  ADDERALL XR Take 15 mg by mouth 2 (two) times daily.   amphetamine-dextroamphetamine 30 MG tablet Commonly known as:  ADDERALL Take 30 mg by mouth 2 (two) times daily.   cefUROXime 500 MG tablet Commonly known as:  CEFTIN Take 1 tablet (500 mg total) by mouth 2 (two) times daily with a meal.   clonazePAM 1 MG tablet Commonly known as:  KLONOPIN Take 1 tablet (1 mg total) by mouth 2 (two) times daily. What changed:  when to take this   cyclobenzaprine 10 MG tablet Commonly known as:  FLEXERIL Take 1 tablet (10 mg total) by mouth 3 (three) times daily as needed for muscle spasms.       Vitals:   12/08/15 2031 12/09/15 0458  BP: 125/84 126/69  Pulse: 92 94  Resp: 18 18  Temp: 98.1 F (36.7 C) 98.3 F (36.8 C)    Skin clean, dry and intact without evidence of skin break down, no evidence of skin tears noted. IV catheter discontinued intact. Site without signs and symptoms of complications. Dressing and pressure applied. Pt denies pain at this time. No complaints noted.  An After Visit Summary was printed and given to the patient. Patient escorted via WC, and D/C home via private auto.  Bradly Chrisougherty, Filomena Pokorney E

## 2015-12-09 NOTE — Care Management Note (Signed)
Case Management Note  Patient Details  Name: Tammy Hickman MRN: [id number] Date of Birth: [birth year]  Subjective/Objective:             Encouraged uninsured Tammy Hickman to reapply for Medicaid at DSS. Provided Tammy Hickman with an [facility] the [facility] and [facility], and with a list of local clinics who provide medical care for the uninsured. Tammy Hickman repeated these instructions back and agreed to follow-up with one or more of these community providers.        Action/Plan:   Expected Discharge Date:                  Expected Discharge Plan:     In-House Referral:     Discharge planning Services     Post Acute Care Choice:    Choice offered to:     DME Arranged:    DME Agency:     HH Arranged:    HH Agency:     Status of Service:     If discussed at [employer] Length of Stay Meetings, dates discussed:    Additional Comments:  Airica Schwartzkopf A, RN 12/09/2015, 9:55 AM

## 2015-12-09 NOTE — Discharge Summary (Signed)
[facility] - [place] at [facility]   PATIENT NAME: Tammy Hickman    MR#:  [id number]  DATE OF BIRTH:  11-04-1990  DATE OF ADMISSION:  12/08/2015 ADMITTING PHYSICIAN: Tonye Royalty, DO  DATE OF DISCHARGE: 12/09/2015  PRIMARY CARE PHYSICIAN: Fidel Levy, MD    ADMISSION DIAGNOSIS:  Pyelonephritis [N12] Sepsis, due to unspecified organism [facility]) [A41.9]  DISCHARGE DIAGNOSIS:  Active Problems:   Pyelonephritis   SECONDARY DIAGNOSIS:   Past Medical History:  Diagnosis Date  . Anxiety   . Asthma   . Bipolar disorder (HCC)   . Hernia, umbilical 2011  . Mental disorders of mother, antepartum   . Obesity complicating pregnancy, childbirth, or the puerperium, antepartum condition or complication(649.13)   . Other specified indication for care or intervention related to labor and delivery, antepartum   . Thyroid dysfunction     HOSPITAL COURSE:   25 year old female with a history of anxiety who was admitted with sepsis due to acute pyelonephritis  1. Sepsis due to pyelonephritis: She was On IV Rocephin. Her PCP will need to follow-up on final blood and urine cultures.  She was discharged Ceftin. She was afebrile throughout the hospital stay.   2. Pyelonephritis: Plan as outlined above 3. History of cocaine abuse: She counseled to stop cocaine and illicit drugs 4. History of anxiety: Continue Klonopin    DISCHARGE CONDITIONS AND DIET:   Stable for discharge and related I  CONSULTS OBTAINED:    DRUG ALLERGIES:   Allergies  Allergen Reactions  . Fentanyl Hives  . Meloxicam Other (See Comments)    Knees locked up  . Penicillins Other (See Comments)    Chest pains  . Prednisone     Unsure of reaction  . Hydromorphone Hives and Rash    States had redness around IV site during IV admin. States given benadryl and went away.    DISCHARGE MEDICATIONS:   Current Discharge Medication List    START taking these medications    Details  cefUROXime (CEFTIN) 500 MG tablet Take 1 tablet (500 mg total) by mouth 2 (two) times daily with a meal. Qty: 20 tablet, Refills: 0      CONTINUE these medications which have CHANGED   Details  clonazePAM (KLONOPIN) 1 MG tablet Take 1 tablet (1 mg total) by mouth 2 (two) times daily. Qty: 30 tablet, Refills: 0      CONTINUE these medications which have NOT CHANGED   Details  acetaminophen (TYLENOL) 325 MG tablet Take 2 tablets (650 mg total) by mouth every 6 (six) hours as needed for moderate pain. Qty: 30 tablet, Refills: 0    amphetamine-dextroamphetamine (ADDERALL XR) 15 MG 24 hr capsule Take 15 mg by mouth 2 (two) times daily.    amphetamine-dextroamphetamine (ADDERALL) 30 MG tablet Take 30 mg by mouth 2 (two) times daily.    cyclobenzaprine (FLEXERIL) 10 MG tablet Take 1 tablet (10 mg total) by mouth 3 (three) times daily as needed for muscle spasms. Qty: 30 tablet, Refills: 0              Today   CHIEF COMPLAINT:   Patient drinks really well this morning. Denies nausea, fevers, back pain   VITAL SIGNS:  Blood pressure 126/69, pulse 94, temperature 98.3 F (36.8 C), temperature source Oral, resp. rate 18, height 5\' 8"  (1.727 m), weight 119 kg (262 lb 6.4 oz), last menstrual period 11/23/2015, SpO2 98 %.   REVIEW OF SYSTEMS:  Review of Systems  Constitutional:  Negative.  Negative for chills, fever and malaise/fatigue.  HENT: Negative.  Negative for ear discharge, ear pain, hearing loss, nosebleeds and sore throat.   Eyes: Negative.  Negative for blurred vision and pain.  Respiratory: Negative.  Negative for cough, hemoptysis, shortness of breath and wheezing.   Cardiovascular: Negative.  Negative for chest pain, palpitations and leg swelling.  Gastrointestinal: Negative.  Negative for abdominal pain, blood in stool, diarrhea, nausea and vomiting.  Genitourinary: Negative.  Negative for dysuria.  Musculoskeletal: Negative.  Negative for back pain.   Skin: Negative.   Neurological: Negative for dizziness, tremors, speech change, focal weakness, seizures and headaches.  Endo/Heme/Allergies: Negative.  Does not bruise/bleed easily.  Psychiatric/Behavioral: Negative.  Negative for depression, hallucinations and suicidal ideas.     PHYSICAL EXAMINATION:  GENERAL:  25 y.o.-year-old patient lying in the bed with no acute distress.  NECK:  Supple, no jugular venous distention. No thyroid enlargement, no tenderness.  LUNGS: Normal breath sounds bilaterally, no wheezing, rales,rhonchi  No use of accessory muscles of respiration.  CARDIOVASCULAR: S1, S2 normal. No murmurs, rubs, or gallops.  ABDOMEN: Soft, non-tender, non-distended. Bowel sounds present. No organomegaly or mass.  EXTREMITIES: No pedal edema, cyanosis, or clubbing.  PSYCHIATRIC: The patient is alert and oriented x 3.  SKIN: No obvious rash, lesion, or ulcer.   DATA REVIEW:   CBC  Recent Labs Lab 12/09/15 0501  WBC 6.7  HGB 13.3  HCT 40.7  PLT 179    Chemistries   Recent Labs Lab 12/08/15 0202 12/09/15 0501  NA 136 138  K 3.4* 4.3  CL 100* 108  CO2 29 24  GLUCOSE 98 94  BUN 10 10  CREATININE 0.86 0.50  CALCIUM 9.0 8.7*  AST 30  --   ALT 27  --   ALKPHOS 61  --   BILITOT 0.8  --     Cardiac Enzymes No results for input(s): TROPONINI in the last 168 hours.  Microbiology Results  @[email or web address] @  RADIOLOGY:  Ct Renal Stone Study  Result Date: 12/08/2015 CLINICAL DATA:  25 year old female with flank pain. Evaluate for stone. EXAM: CT ABDOMEN AND PELVIS WITHOUT CONTRAST TECHNIQUE: Multidetector CT imaging of the abdomen and pelvis was performed following the standard protocol without IV contrast. COMPARISON:  CT dated 05/05/2015 FINDINGS: Evaluation of this exam is limited in the absence of intravenous contrast. Lower chest: Bibasilar linear atelectasis/ scarring. The lung bases are otherwise clear. No intra-abdominal free air.  No free fluid.  Hepatobiliary: No focal liver abnormality is seen. No gallstones, gallbladder wall thickening, or biliary dilatation. Pancreas: Unremarkable. No pancreatic ductal dilatation or surrounding inflammatory changes. Spleen: Normal in size without focal abnormality. Adrenals/Urinary Tract: Adrenal glands are unremarkable. Kidneys are normal, without renal calculi, focal lesion, or hydronephrosis. Bladder is unremarkable. Stomach/Bowel: There is moderate stool throughout the colon. There is a small cecal diverticulum without active inflammatory changes. No evidence of bowel obstruction or active inflammation. Appendectomy Vascular/Lymphatic: The abdominal aorta and IVC are grossly unremarkable on noncontrast study. No portal venous gas identified. There is no adenopathy. Reproductive: The uterus and ovaries are grossly unremarkable. Other: There is mild subcutaneous stranding of the lower abdomen as well as stranding of the lower lumbar subcutaneous fat. No fluid collection. Musculoskeletal: No acute or significant osseous findings. IMPRESSION: No acute intra-abdominal or pelvic pathology. No hydronephrosis or nephrolithiasis. Constipation. No evidence of bowel obstruction or active inflammation. The small cecal diverticulum without active inflammatory changes. Electronically Signed   By: Ceasar MonsArash  Radparvar M.D.  On: 12/08/2015 05:37      Management plans discussed with the patient and she is in agreement. Stable for discharge   Patient should follow up with pcp  CODE STATUS:     Code Status Orders        Start     Ordered   12/08/15 0751  Full code  Continuous     12/08/15 0750    Code Status History    Date Active Date Inactive Code Status Order ID Comments User Context   11/03/2015  5:14 PM 11/04/2015  8:32 AM Full Code [id number]  Shaune Pollack, MD ED      TOTAL TIME TAKING CARE OF THIS PATIENT: 36 minutes.    Note: This dictation was prepared with Dragon dictation along with smaller phrase  technology. Any transcriptional errors that result from this process are unintentional.  Kelty Szafran M.D on 12/09/2015 at 11:38 AM  Between 7am to 6pm - Pager - [phone] After 6pm go to [email or web address] - Social research officer, government  [facility]  Office  [phone]  CC: Primary care physician; Fidel Levy, MD

## 2015-12-12 LAB — URINE CULTURE

## 2015-12-13 LAB — CULTURE, BLOOD (ROUTINE X 2)
CULTURE: NO GROWTH
Culture: NO GROWTH

## 2015-12-23 ENCOUNTER — Inpatient Hospital Stay: Payer: Self-pay | Admitting: Family Medicine

## 2016-01-04 DIAGNOSIS — R791 Abnormal coagulation profile: Secondary | ICD-10-CM | POA: Insufficient documentation

## 2016-01-04 DIAGNOSIS — F909 Attention-deficit hyperactivity disorder, unspecified type: Secondary | ICD-10-CM | POA: Insufficient documentation

## 2016-01-04 DIAGNOSIS — G834 Cauda equina syndrome: Secondary | ICD-10-CM | POA: Insufficient documentation

## 2016-01-04 DIAGNOSIS — F17200 Nicotine dependence, unspecified, uncomplicated: Secondary | ICD-10-CM | POA: Insufficient documentation

## 2016-01-04 DIAGNOSIS — M5442 Lumbago with sciatica, left side: Secondary | ICD-10-CM | POA: Insufficient documentation

## 2016-01-04 DIAGNOSIS — Z79899 Other long term (current) drug therapy: Secondary | ICD-10-CM | POA: Insufficient documentation

## 2016-01-04 DIAGNOSIS — J45909 Unspecified asthma, uncomplicated: Secondary | ICD-10-CM | POA: Insufficient documentation

## 2016-01-05 ENCOUNTER — Emergency Department: Payer: Self-pay

## 2016-01-05 ENCOUNTER — Emergency Department
Admission: EM | Admit: 2016-01-05 | Discharge: 2016-01-05 | Payer: Self-pay | Attending: Emergency Medicine | Admitting: Emergency Medicine

## 2016-01-05 DIAGNOSIS — G834 Cauda equina syndrome: Secondary | ICD-10-CM

## 2016-01-05 DIAGNOSIS — R202 Paresthesia of skin: Secondary | ICD-10-CM

## 2016-01-05 DIAGNOSIS — M5442 Lumbago with sciatica, left side: Secondary | ICD-10-CM

## 2016-01-05 DIAGNOSIS — M549 Dorsalgia, unspecified: Secondary | ICD-10-CM

## 2016-01-05 LAB — CBC WITH DIFFERENTIAL/PLATELET
BASOS ABS: 0 10*3/uL (ref 0–0.1)
BASOS PCT: 0 %
EOS PCT: 7 %
Eosinophils Absolute: 0.6 10*3/uL (ref 0–0.7)
HCT: 39.1 % (ref 35.0–47.0)
Hemoglobin: 13.2 g/dL (ref 12.0–16.0)
LYMPHS PCT: 26 %
Lymphs Abs: 2.4 10*3/uL (ref 1.0–3.6)
MCH: 29 pg (ref 26.0–34.0)
MCHC: 33.7 g/dL (ref 32.0–36.0)
MCV: 85.8 fL (ref 80.0–100.0)
Monocytes Absolute: 0.7 10*3/uL (ref 0.2–0.9)
Monocytes Relative: 7 %
Neutro Abs: 5.5 10*3/uL (ref 1.4–6.5)
Neutrophils Relative %: 60 %
PLATELETS: 244 10*3/uL (ref 150–440)
RBC: 4.56 MIL/uL (ref 3.80–5.20)
RDW: 15.8 % — AB (ref 11.5–14.5)
WBC: 9.3 10*3/uL (ref 3.6–11.0)

## 2016-01-05 LAB — URINALYSIS COMPLETE WITH MICROSCOPIC (ARMC ONLY)
BILIRUBIN URINE: NEGATIVE
GLUCOSE, UA: NEGATIVE mg/dL
Hgb urine dipstick: NEGATIVE
KETONES UR: NEGATIVE mg/dL
Nitrite: NEGATIVE
Protein, ur: NEGATIVE mg/dL
Specific Gravity, Urine: 1.017 (ref 1.005–1.030)
pH: 5 (ref 5.0–8.0)

## 2016-01-05 LAB — BASIC METABOLIC PANEL
ANION GAP: 5 (ref 5–15)
BUN: 14 mg/dL (ref 6–20)
CALCIUM: 8.8 mg/dL — AB (ref 8.9–10.3)
CO2: 28 mmol/L (ref 22–32)
Chloride: 105 mmol/L (ref 101–111)
Creatinine, Ser: 0.75 mg/dL (ref 0.44–1.00)
GFR calc Af Amer: >60 mL/min (ref >60)
Glucose, Bld: 94 mg/dL (ref 65–99)
Potassium: 3.7 mmol/L (ref 3.5–5.1)
SODIUM: 138 mmol/L (ref 135–145)

## 2016-01-05 LAB — PREGNANCY, URINE: Preg Test, Ur: NEGATIVE

## 2016-01-05 LAB — PROTIME-INR
INR: 0.92
PROTHROMBIN TIME: 12.4 s (ref 11.4–15.2)

## 2016-01-05 LAB — APTT: APTT: 31 s (ref 24–36)

## 2016-01-05 LAB — POCT PREGNANCY, URINE: PREG TEST UR: NEGATIVE

## 2016-01-05 MED ORDER — DEXAMETHASONE SODIUM PHOSPHATE 10 MG/ML IJ SOLN
10.0000 mg | Freq: Once | INTRAMUSCULAR | Status: AC
Start: 2016-01-05 — End: 2016-01-05
  Administered 2016-01-05: 10 mg via INTRAVENOUS
  Filled 2016-01-05: qty 1

## 2016-01-05 MED ORDER — MIDAZOLAM HCL 2 MG/2ML IJ SOLN
2.0000 mg | Freq: Once | INTRAMUSCULAR | Status: AC
  Administered 2016-01-05: 2 mg via INTRAMUSCULAR

## 2016-01-05 MED ORDER — DIPHENHYDRAMINE HCL 50 MG/ML IJ SOLN
25.0000 mg | Freq: Once | INTRAMUSCULAR | Status: AC
  Administered 2016-01-05: 25 mg via INTRAVENOUS
  Filled 2016-01-05: qty 1

## 2016-01-05 MED ORDER — LIDOCAINE 5 % EX PTCH
1.0000 | MEDICATED_PATCH | CUTANEOUS | Status: DC
  Administered 2016-01-05: 1 via TRANSDERMAL
  Filled 2016-01-05: qty 1

## 2016-01-05 MED ORDER — MORPHINE SULFATE (PF) 4 MG/ML IV SOLN
4.0000 mg | Freq: Once | INTRAVENOUS | Status: AC
  Administered 2016-01-05: 4 mg via INTRAVENOUS
  Filled 2016-01-05: qty 1

## 2016-01-05 MED ORDER — MIDAZOLAM HCL 2 MG/2ML IJ SOLN
INTRAMUSCULAR | Status: AC
  Administered 2016-01-05: 2 mg via INTRAMUSCULAR
  Filled 2016-01-05: qty 2

## 2016-01-05 MED ORDER — KETOROLAC TROMETHAMINE 60 MG/2ML IM SOLN
60.0000 mg | Freq: Once | INTRAMUSCULAR | Status: AC
  Administered 2016-01-05: 60 mg via INTRAMUSCULAR
  Filled 2016-01-05: qty 2

## 2016-01-05 NOTE — ED Notes (Signed)
Pt medicated for pain and then to MRI. Patient states unable to lay on back due to pain so MRI not done. Patient moving all extremities well. Returns to sleeping immediately on return to room. Gentle shaking to waken. Enc try MRI now meds seem to be working. Request MRI another attempt. Will notify me when ready.

## 2016-01-05 NOTE — ED Notes (Addendum)
Pt reports on 12/23/15 she woke up with numb feeling to her left lower leg. Leg feels like it is swelling and pt reports weakness. Pt reports 2 to 3 days ago she twisted her left ankle due to the pain. Pt denies injury to the leg. Pt also co pain from her lower back down her left leg. Pt also denies injury to her back. Pt with CMS intact to LLE. Pulses

## 2016-01-05 NOTE — ED Provider Notes (Signed)
[facility] Emergency Department Provider Note   ____________________________________________   First MD Initiated Contact with Patient 01/05/16 0143     (approximate)  I have reviewed the triage vital signs and the nursing notes.   HISTORY  Chief Complaint Hip Pain    HPI Genia HotterBrittany A Gambale is a 25 y.o. female comes into the hospital today with some pain and numbness to her left leg. The patient reports that she woke up on Halloween and had some numbness to her leg. She thought it was initially just to sleep so she tried to walk around but it did not help. She reports that since then she has been having pins and needles to her leg. She reports that she occasionally has numbness to her private areas under her leg hurts really bad. She reports that the area to her back and her legs seem swollen. The patient has not seen her doctor because she reports her Medicaid is wrong. The patient reports that she has been taking Advil for pain. She reports that she fell at home 2 days ago and has some soreness of her left ankle as well. The patient denies any trauma that incited the pain up her back. The patient reports that it hurts so bad and rates her pain a 10 out of 10 in intensity. She reports that nothing makes it better or worse. She reports that sometimes laying in a certain position may help but not for long. The patient denies any incontinence.   Past Medical History:  Diagnosis Date  . Anxiety   . Asthma   . Bipolar disorder (HCC)   . Hernia, umbilical 2011  . Mental disorders of mother, antepartum   . Obesity complicating pregnancy, childbirth, or the puerperium, antepartum condition or complication(649.13)   . Other specified indication for care or intervention related to labor and delivery, antepartum   . Thyroid dysfunction     Patient Active Problem List   Diagnosis Date Noted  . Pyelonephritis 12/08/2015  . Cocaine abuse   . Elevated troponin I  level   . Leg swelling   . NSTEMI (non-ST elevated myocardial infarction) (HCC) 11/03/2015  . Low back pain 01/13/2015  . Leg pain, right 01/13/2015  . Opiate overdose 10/31/2014  . Dysthymia 10/31/2014  . ADHD (attention deficit hyperactivity disorder) 10/31/2014  . Obesity 07/27/2011  . Contraception, generic surveillance 07/27/2011    Past Surgical History:  Procedure Laterality Date  . ABDOMINAL SURGERY    . HERNIA REPAIR     umbilical hernia  . TONSILLECTOMY  2009    Prior to Admission medications   Medication Sig Start Date End Date Taking? Authorizing Provider  acetaminophen (TYLENOL) 325 MG tablet Take 2 tablets (650 mg total) by mouth every 6 (six) hours as needed for moderate pain. 10/09/15   Jami L Hagler, PA-C  amphetamine-dextroamphetamine (ADDERALL XR) 15 MG 24 hr capsule Take 15 mg by mouth 2 (two) times daily.    Historical Provider, MD  amphetamine-dextroamphetamine (ADDERALL) 30 MG tablet Take 30 mg by mouth 2 (two) times daily.    Historical Provider, MD  cefUROXime (CEFTIN) 500 MG tablet Take 1 tablet (500 mg total) by mouth 2 (two) times daily with a meal. 12/09/15   Sital Mody, MD  clonazePAM (KLONOPIN) 1 MG tablet Take 1 tablet (1 mg total) by mouth 2 (two) times daily. 12/09/15   Adrian SaranSital Mody, MD  cyclobenzaprine (FLEXERIL) 10 MG tablet Take 1 tablet (10 mg total) by mouth 3 (  three) times daily as needed for muscle spasms. 11/04/15   Milagros LollSrikar Sudini, MD    Allergies Fentanyl; Meloxicam; Penicillins; Prednisone; and Hydromorphone  Family History  Problem Relation Age of Onset  . Cancer Paternal Grandfather     Esophageal  . Heart disease Paternal Grandmother   . Diabetes Maternal Grandmother   . Heart disease Maternal Grandfather   . Diabetes Maternal Grandfather   . Cancer Maternal Grandfather     Esophageal  . Heart disease Maternal Aunt     Fatal MI in late-30's    Social History Social History  Substance Use Topics  . Smoking status: Light  Tobacco Smoker    Packs/day: 0.20    Last attempt to quit: 11/14/2009  . Smokeless tobacco: Never Used  . Alcohol use No    Review of Systems Constitutional: No fever/chills Eyes: No visual changes. ENT: No sore throat. Cardiovascular: Denies chest pain. Respiratory: Denies shortness of breath. Gastrointestinal: No abdominal pain.  No nausea, no vomiting.  No diarrhea.  No constipation. Genitourinary: Negative for dysuria. Musculoskeletal:  back pain, left leg pain Skin: Negative for rash. Neurological: Negative for headaches, focal weakness or numbness.  10-point ROS otherwise negative.  ____________________________________________   PHYSICAL EXAM:  VITAL SIGNS: ED Triage Vitals  Enc Vitals Group     BP 01/05/16 0014 122/76     Pulse Rate 01/05/16 0014 (!) 112     Resp 01/05/16 0014 18     Temp 01/05/16 0014 98.6 F (37 C)     Temp Source 01/05/16 0014 Oral     SpO2 01/05/16 0014 100 %     Weight 01/05/16 0014 240 lb (108.9 kg)     Height 01/05/16 0014 5\' 8"  (1.727 m)     Head Circumference --      Peak Flow --      Pain Score 01/05/16 0010 9     Pain Loc --      Pain Edu? --      Excl. in GC? --     Constitutional: Alert and oriented. Well appearing and in Moderate distress. Eyes: Conjunctivae are normal. PERRL. EOMI. Head: Atraumatic. Nose: No congestion/rhinnorhea. Mouth/Throat: Mucous membranes are moist.  Oropharynx non-erythematous. Cardiovascular: Normal rate, regular rhythm. Grossly normal heart sounds.  Good peripheral circulation. Respiratory: Normal respiratory effort.  No retractions. Lungs CTAB. Gastrointestinal: Soft and nontender. No distention. Positive bowel sounds Musculoskeletal: Tenderness to palpation of lumbar spine and left SI joint with positive straight leg raise.  Neurologic:  Normal speech and language. Patient reports some tingling/pins and needle sensation to her genital area Skin:  Skin is warm, dry and intact.  Psychiatric: Mood  and affect are normal.   ____________________________________________   LABS (all labs ordered are listed, but only abnormal results are displayed)  Labs Reviewed  POC URINE PREG, ED  POCT PREGNANCY, URINE   ____________________________________________  EKG  none ____________________________________________  RADIOLOGY  Lumbar spine x-ray ____________________________________________   PROCEDURES  Procedure(s) performed: None  Procedures  Critical Care performed: No  ____________________________________________   INITIAL IMPRESSION / ASSESSMENT AND PLAN / ED COURSE  Pertinent labs & imaging results that were available during my care of the patient were reviewed by me and considered in my medical decision making (see chart for details).  This is a 25 year old female who comes into the hospital today with some right leg numbness and some low back pain. I will give the patient a Lidoderm patch to her back as well as shot of  Toradol. Also the patient for lumbar spine x-ray. I will also perform a rectal exam to evaluate for decreased tone and consider an MRI given the patient's genital paresthesia.  Clinical Course as of Jan 04 817  Mon Jan 05, 2016  0458 Moderate curvature and degenerative changes, greater than typical for the patient's age.   DG Lumbar Spine 2-3 Views [AW]    Clinical Course User Index [AW] Rebecka Apley, MD    The patient's care will be signed out to Dr. Scotty Court who will follow-up the results of the patient's MRI. ____________________________________________   FINAL CLINICAL IMPRESSION(S) / ED DIAGNOSES  Final diagnoses:  Back pain  Acute left-sided low back pain with left-sided sciatica  Paresthesia      NEW MEDICATIONS STARTED DURING THIS VISIT:  New Prescriptions   No medications on file     Note:  This document was prepared using Dragon voice recognition software and may include unintentional dictation errors.      Rebecka Apley, MD 01/05/16 [phone]

## 2016-01-05 NOTE — ED Notes (Signed)
Patient transported to X-ray 

## 2016-01-05 NOTE — ED Notes (Signed)
[facility]  [facility]  [facility]  ALL  PAPERWORK  AND  XRAY  SENT  WITH  [facility]  GROUND  UNIT

## 2016-01-05 NOTE — ED Provider Notes (Signed)
 -----------------------------------------   10:58 AM on 01/05/2016 -----------------------------------------  MRI result obtained. Shows significant disc extrusion with thecal sac mass effect at the L2-L3 level, likely producing her symptoms. Ordered morphine and Decadron. We'll check labs place IV, and arrange [facility] to [facility] for [facility]surgical evaluation.    ----------------------------------------- 11:12 AM on 01/05/2016 ----------------------------------------- [facility].  [facility] to provide ground transport. Hemodynamically stable.   CRITICAL CARE Performed by: Scotty CourtSTAFFORD, Jorrell Kuster   Total critical care time: 30 minutes  Critical care time was exclusive of separately billable procedures and treating other patients.  Critical care was necessary to treat or prevent imminent or life-threatening deterioration.  Critical care was time spent personally by me on the following activities: development of treatment plan with patient and/or surrogate as well as nursing, discussions with consultants, evaluation of patient's response to treatment, examination of patient, obtaining history from patient or surrogate, ordering and performing treatments and interventions, ordering and review of laboratory studies, ordering and review of radiographic studies, pulse oximetry and re-evaluation of patient's condition.      Final diagnoses:  Back pain  Acute left-sided low back pain with left-sided sciatica  Paresthesia  Cauda equina syndrome [facility](HCC)      Sharman CheekPhillip Othello Sgroi, MD 01/05/16 1113

## 2016-01-05 NOTE — ED Notes (Signed)
Patient now sitting up in recliner, sitter at bedside. Cont to [facility] well. Falls asleep when undisturbed.

## 2016-01-05 NOTE — ED Notes (Signed)
Pt awakened to tell her that the MD has ordered an MRI of her back. Pt verbalized understanding and went back to sleep.

## 2016-01-05 NOTE — Care Management Note (Signed)
Case Management Note  Patient Details  Name: Genia HotterBrittany A Kluger MRN: [id number] Date of Birth: 1990-11-04  Subjective/Objective:   Spoke to patient at bedside to see if I could offer any assistance. I also tried to speak to her about applying for [facility]. Patient states she really doesn't feel like talking right now. Is amenable to me leaving a copy of the information on the program, including an application form in her room.   Rn for the patient, Selena BattenKim, has been made aware of my efforts.               Action/Plan:   Expected Discharge Date:                  Expected Discharge Plan:     In-House Referral:     Discharge planning Services     Post Acute Care Choice:    Choice offered to:     DME Arranged:    DME Agency:     HH Arranged:    HH Agency:     Status of Service:     If discussed at [employer] Length of Stay Meetings, dates discussed:    Additional Comments:  Berna BueCheryl Madelene Kaatz, RN 01/05/2016, 10:36 AM

## 2016-01-05 NOTE — ED Notes (Signed)
Pt sleeping soundly upon entering the room.

## 2016-01-05 NOTE — ED Triage Notes (Signed)
Pt here via ems, pt states that she has been having pain in the left hip and left since oct 31st, pt denies injury, pt states that she has fallen due to the leg being weak

## 2016-01-05 NOTE — ED Notes (Signed)
Patient sleeping.  Easy to wake.  Respirations regular and non labored.  Sitter remains at bedside for safety. Continue to monitor.

## 2016-01-05 NOTE — ED Notes (Signed)
Pt returned from xray

## 2016-01-05 NOTE — ED Notes (Signed)
Report given to [facility].  [facility]

## 2016-01-05 NOTE — ED Notes (Signed)
Patient appears to be sleeping. Noted to turns self in bed. Awakened easily to introduce myself and update patient plan of MRI. Patient returns to sleep quickly when undisturbed.

## 2016-01-05 NOTE — ED Notes (Signed)
AAOx3.  Skin warm and dry. NAD.  [facility].

## 2016-01-11 ENCOUNTER — Emergency Department: Payer: Self-pay

## 2016-01-11 ENCOUNTER — Emergency Department
Admission: EM | Admit: 2016-01-11 | Discharge: 2016-01-12 | Payer: Self-pay | Attending: Emergency Medicine | Admitting: Emergency Medicine

## 2016-01-11 DIAGNOSIS — J45909 Unspecified asthma, uncomplicated: Secondary | ICD-10-CM | POA: Insufficient documentation

## 2016-01-11 DIAGNOSIS — Z79899 Other long term (current) drug therapy: Secondary | ICD-10-CM | POA: Insufficient documentation

## 2016-01-11 DIAGNOSIS — Z5189 Encounter for other specified aftercare: Secondary | ICD-10-CM

## 2016-01-11 DIAGNOSIS — Y658 Other specified misadventures during surgical and medical care: Secondary | ICD-10-CM | POA: Insufficient documentation

## 2016-01-11 DIAGNOSIS — F909 Attention-deficit hyperactivity disorder, unspecified type: Secondary | ICD-10-CM | POA: Insufficient documentation

## 2016-01-11 DIAGNOSIS — M549 Dorsalgia, unspecified: Secondary | ICD-10-CM

## 2016-01-11 DIAGNOSIS — T814XXA Infection following a procedure, initial encounter: Secondary | ICD-10-CM | POA: Insufficient documentation

## 2016-01-11 DIAGNOSIS — F17200 Nicotine dependence, unspecified, uncomplicated: Secondary | ICD-10-CM | POA: Insufficient documentation

## 2016-01-11 DIAGNOSIS — T8140XA Infection following a procedure, unspecified, initial encounter: Secondary | ICD-10-CM

## 2016-01-11 LAB — COMPREHENSIVE METABOLIC PANEL
ALBUMIN: 3.5 g/dL (ref 3.5–5.0)
ALK PHOS: 47 U/L (ref 38–126)
ALT: 13 U/L — AB (ref 14–54)
AST: 17 U/L (ref 15–41)
Anion gap: 8 (ref 5–15)
BUN: 15 mg/dL (ref 6–20)
CALCIUM: 8.6 mg/dL — AB (ref 8.9–10.3)
CHLORIDE: 102 mmol/L (ref 101–111)
CO2: 26 mmol/L (ref 22–32)
CREATININE: 0.68 mg/dL (ref 0.44–1.00)
GFR calc non Af Amer: >60 mL/min (ref >60)
GLUCOSE: 103 mg/dL — AB (ref 65–99)
Potassium: 3.8 mmol/L (ref 3.5–5.1)
SODIUM: 136 mmol/L (ref 135–145)
Total Bilirubin: 0.3 mg/dL (ref 0.3–1.2)
Total Protein: 7.6 g/dL (ref 6.5–8.1)

## 2016-01-11 LAB — CBC
HCT: 36.4 % (ref 35.0–47.0)
HEMOGLOBIN: 12.3 g/dL (ref 12.0–16.0)
MCH: 28.8 pg (ref 26.0–34.0)
MCHC: 33.7 g/dL (ref 32.0–36.0)
MCV: 85.4 fL (ref 80.0–100.0)
PLATELETS: 401 10*3/uL (ref 150–440)
RBC: 4.26 MIL/uL (ref 3.80–5.20)
RDW: 15.1 % — ABNORMAL HIGH (ref 11.5–14.5)
WBC: 13.1 10*3/uL — AB (ref 3.6–11.0)

## 2016-01-11 MED ORDER — ONDANSETRON HCL 4 MG/2ML IJ SOLN
4.0000 mg | Freq: Once | INTRAMUSCULAR | Status: AC
  Administered 2016-01-11: 4 mg via INTRAVENOUS
  Filled 2016-01-11: qty 2

## 2016-01-11 MED ORDER — GADOBENATE DIMEGLUMINE 529 MG/ML IV SOLN
20.0000 mL | Freq: Once | INTRAVENOUS | Status: AC | PRN
  Administered 2016-01-12: 20 mL via INTRAVENOUS

## 2016-01-11 MED ORDER — LORAZEPAM 2 MG/ML IJ SOLN
1.0000 mg | Freq: Once | INTRAMUSCULAR | Status: AC
  Administered 2016-01-11: 1 mg via INTRAVENOUS
  Filled 2016-01-11: qty 1

## 2016-01-11 MED ORDER — MORPHINE SULFATE (PF) 4 MG/ML IV SOLN
4.0000 mg | Freq: Once | INTRAVENOUS | Status: AC
  Administered 2016-01-11: 4 mg via INTRAVENOUS
  Filled 2016-01-11: qty 1

## 2016-01-11 NOTE — ED Provider Notes (Signed)
[facility] Emergency Department Provider Note  Time seen: 10:32 PM  I have reviewed the triage vital signs and the nursing notes.   HISTORY  Chief Complaint Post-op Problem    HPI Tammy Hickman is a 25 y.o. female presents to the emergency department with lower back pain and drainage. According to the patient and record review the patient was seen in the emergency department 01/05/16 had an MRI consistent with cauda equina syndrome and [facility]. Patient was taken to the operating room for an L2/L3 laminectomy. Patient was discharged 01/06/16. Patient states since going home her pain has worsened. Over the past 3 days her pain has become severe in her lower back. States since yesterday she has been having significant drainage from her incision. Denies fever but states she has had chills at home. States some left leg weakness but this is unchanged since the surgery, the patient states she was told it will likely be weak for several months. Patient's main concern today is increased pain in the lower back along with drainage from the incision.  Past Medical History:  Diagnosis Date  . Anxiety   . Asthma   . Bipolar disorder (HCC)   . Hernia, umbilical 2011  . Mental disorders of mother, antepartum   . Obesity complicating pregnancy, childbirth, or the puerperium, antepartum condition or complication(649.13)   . Other specified indication for care or intervention related to labor and delivery, antepartum   . Thyroid dysfunction     Patient Active Problem List   Diagnosis Date Noted  . Pyelonephritis 12/08/2015  . Cocaine abuse   . Elevated troponin I level   . Leg swelling   . NSTEMI (non-ST elevated myocardial infarction) (HCC) 11/03/2015  . Low back pain 01/13/2015  . Leg pain, right 01/13/2015  . Opiate overdose 10/31/2014  . Dysthymia 10/31/2014  . ADHD (attention deficit hyperactivity disorder) 10/31/2014  . Obesity  07/27/2011  . Contraception, generic surveillance 07/27/2011    Past Surgical History:  Procedure Laterality Date  . ABDOMINAL SURGERY    . BACK SURGERY    . HERNIA REPAIR     umbilical hernia  . TONSILLECTOMY  2009    Prior to Admission medications   Medication Sig Start Date End Date Taking? Authorizing Provider  acetaminophen (TYLENOL) 325 MG tablet Take 2 tablets (650 mg total) by mouth every 6 (six) hours as needed for moderate pain. 10/09/15   Jami L Hagler, PA-C  amphetamine-dextroamphetamine (ADDERALL XR) 15 MG 24 hr capsule Take 15 mg by mouth 2 (two) times daily.    Historical Provider, MD  amphetamine-dextroamphetamine (ADDERALL) 30 MG tablet Take 30 mg by mouth 2 (two) times daily.    Historical Provider, MD  cefUROXime (CEFTIN) 500 MG tablet Take 1 tablet (500 mg total) by mouth 2 (two) times daily with a meal. 12/09/15   Sital Mody, MD  clonazePAM (KLONOPIN) 1 MG tablet Take 1 tablet (1 mg total) by mouth 2 (two) times daily. 12/09/15   Adrian SaranSital Mody, MD  cyclobenzaprine (FLEXERIL) 10 MG tablet Take 1 tablet (10 mg total) by mouth 3 (three) times daily as needed for muscle spasms. 11/04/15   Milagros LollSrikar Sudini, MD    Allergies  Allergen Reactions  . Fentanyl Hives  . Meloxicam Other (See Comments)    Knees locked up  . Penicillins Other (See Comments)    Chest pains  . Prednisone     Unsure of reaction  . Hydromorphone Hives and Rash  States had redness around IV site during IV admin. States given benadryl and went away.    Family History  Problem Relation Age of Onset  . Cancer Paternal Grandfather     Esophageal  . Heart disease Paternal Grandmother   . Diabetes Maternal Grandmother   . Heart disease Maternal Grandfather   . Diabetes Maternal Grandfather   . Cancer Maternal Grandfather     Esophageal  . Heart disease Maternal Aunt     Fatal MI in late-30's    Social History Social History  Substance Use Topics  . Smoking status: Light Tobacco Smoker     Packs/day: 0.20    Last attempt to quit: 11/14/2009  . Smokeless tobacco: Never Used  . Alcohol use No    Review of Systems Constitutional: Negative for fever. Cardiovascular: Negative for chest pain. Respiratory: Negative for shortness of breath. Gastrointestinal: Negative for abdominal pain, Negative for vomiting  Genitourinary: Negative for dysuria. Musculoskeletal: Lower back pain. Positive for incisional drainage. Neurological: Negative for headache. Denies any new weakness or numbness. 10-point ROS otherwise negative.  ____________________________________________   PHYSICAL EXAM:  VITAL SIGNS: ED Triage Vitals [01/11/16 2222]  Enc Vitals Group     BP (!) 131/116     Pulse Rate (!) 108     Resp 18     Temp 98.5 F (36.9 C)     Temp Source Oral     SpO2 99 %     Weight 240 lb (108.9 kg)     Height 5\' 8"  (1.727 m)     Head Circumference      Peak Flow      Pain Score 8     Pain Loc      Pain Edu?      Excl. in GC?     Constitutional: Alert and oriented. Well appearing and in no distress. Eyes: Normal exam ENT   Head: Normocephalic and atraumatic   Mouth/Throat: Mucous membranes are moist. Cardiovascular: Normal rate, regular rhythm. No murmur Respiratory: Normal respiratory effort without tachypnea nor retractions. Breath sounds are clear  Gastrointestinal: Soft and nontender. No distention.   Musculoskeletal: Nontender with normal range of motion in all extremities. Patient does have an approximate 10 cm incision to her lower back. Mild surrounding erythema. No notable drainage. Neurologic:  Normal speech and language. 4+/5 strength in left lower extremity. Patient denies any new weakness since the surgery. Skin:  Skin is warm. Psychiatric: Mood and affect are normal.   ____________________________________________    RADIOLOGY  MRI pending  ____________________________________________   INITIAL IMPRESSION / ASSESSMENT AND PLAN / ED  COURSE  Pertinent labs & imaging results that were available during my care of the patient were reviewed by me and considered in my medical decision making (see chart for details).  Patient presents to the emergency department increased pain and drainage from her incision. Denies any new neurological deficits. Patient does have slightly decreased strength in the left lower extremity however the patient states this is unchanged since her surgery and she was told this will likely be this way for several months. No notable drainage currently. She doesn't mild erythema surrounding the incision. Sutures are in place. No dehiscence. Given the patient's drainage with increased pain complaints we'll proceed with an MRI of the lumbar spine to further evaluate.  MRI pending. Pt care signed out to Dr. Zenda AlpersWebster.  ____________________________________________   FINAL CLINICAL IMPRESSION(S) / ED DIAGNOSES  Postsurgical pain    Minna AntisKevin Dannell Gortney, MD 01/11/16 [phone]

## 2016-01-11 NOTE — ED Triage Notes (Signed)
Pt [facility]  Pt reports being d/c on Tuesday.  Pt denies being given antibiotics for post op infection prevention.  Pt states she took percocet 3 hours ago.  Pt reports having burning sensation to incision and reports chills off an on at home.  Pt denies taking temperature at home.  EMS reports some yellow drainage oozing from site.

## 2016-01-12 MED ORDER — MORPHINE SULFATE (PF) 4 MG/ML IV SOLN
2.0000 mg | Freq: Once | INTRAVENOUS | Status: AC
  Administered 2016-01-12: 2 mg via INTRAVENOUS
  Filled 2016-01-12: qty 1

## 2016-01-12 NOTE — ED Provider Notes (Signed)
-----------------------------------------   1:11 AM on 01/12/2016 -----------------------------------------   Blood pressure 114/85, pulse 93, temperature 98.5 F (36.9 C), temperature source Oral, resp. rate 16, height 5\' 8"  (1.727 m), weight 240 lb (108.9 kg), last menstrual period 12/24/2015, SpO2 97 %.  Assuming care from Dr. Lenard LancePaduchowski.  In short, [place]ittany A Fonte is a 25 y.o. female with a chief complaint of Post-op Problem .  Refer to the original H&P for additional details.  The current plan of care is to follow up the results of the MRI.   MRI Lumbar spine  1. Status post posterior decompression the L2-L3 level with improved  patency of the spinal canal.  2. Posterior fluid collection at the operative head exerts mass  effect on the thecal sac resulting in moderate narrowing. There are  no specific features to determine whether the collection is  infected.  3. Contrast enhancement of multiple lower lumbar nerve roots is  likely postsurgical or related to prior compressive injury. There is  no clumping or other evidence of acute arachnoiditis.   I contacted [facility] and spoke to the physician Dr. Laqueta JeanQuinsey who was the neurosurgeon and wanted the patient sent over for evaluation. She wants the patient sent to the emergency department. I spoke to Dr. Laurell JosephsBurke in the emergency department who accepts the patient in transfer. [facility].   Rebecka ApleyAllison P Webster, MD 01/12/16 [phone]

## 2016-01-16 LAB — CULTURE, BLOOD (ROUTINE X 2): Culture: NO GROWTH

## 2016-02-08 DIAGNOSIS — F17200 Nicotine dependence, unspecified, uncomplicated: Secondary | ICD-10-CM | POA: Insufficient documentation

## 2016-02-08 DIAGNOSIS — Z5321 Procedure and treatment not carried out due to patient leaving prior to being seen by health care provider: Secondary | ICD-10-CM | POA: Insufficient documentation

## 2016-02-08 DIAGNOSIS — Z79899 Other long term (current) drug therapy: Secondary | ICD-10-CM | POA: Insufficient documentation

## 2016-02-08 DIAGNOSIS — J45909 Unspecified asthma, uncomplicated: Secondary | ICD-10-CM | POA: Insufficient documentation

## 2016-02-08 DIAGNOSIS — M542 Cervicalgia: Secondary | ICD-10-CM | POA: Insufficient documentation

## 2016-02-08 NOTE — ED Notes (Signed)
Pt arrived via EMS-pt was trespassed from where she was staying tonight; then she began c/o neck and back pain; pt has had 2 surgeries recently on her back; BP 124/68

## 2016-02-08 NOTE — ED Notes (Signed)
Pt in no distress in lobby; laughing and chatting with other pts/visitors; tipping head back without difficulty to sip soda; waiting for triage

## 2016-02-09 ENCOUNTER — Emergency Department
Admission: EM | Admit: 2016-02-09 | Discharge: 2016-02-09 | Disposition: A | Discharge status: Home / Self Care | Payer: Self-pay | Attending: Emergency Medicine | Admitting: Emergency Medicine

## 2016-02-09 NOTE — ED Triage Notes (Signed)
Pt states that she has had 2 recent back surgeries.  Pt c/o neck pain and numbness.  Pt also c/o numbness to her thigh.  Pt in NAD in triage.  Pt in wheelchair.  [facility] by [facility].

## 2016-02-13 LAB — CULTURE, BLOOD (ROUTINE X 2): Culture: NO GROWTH

## 2016-03-09 ENCOUNTER — Emergency Department
Admission: EM | Admit: 2016-03-09 | Discharge: 2016-03-09 | Disposition: A | Discharge status: Home / Self Care | Payer: Self-pay | Attending: Emergency Medicine | Admitting: Emergency Medicine

## 2016-03-09 ENCOUNTER — Emergency Department: Payer: Self-pay

## 2016-03-09 DIAGNOSIS — R8299 Other abnormal findings in urine: Secondary | ICD-10-CM | POA: Insufficient documentation

## 2016-03-09 DIAGNOSIS — M545 Low back pain, unspecified: Secondary | ICD-10-CM

## 2016-03-09 DIAGNOSIS — F17200 Nicotine dependence, unspecified, uncomplicated: Secondary | ICD-10-CM | POA: Insufficient documentation

## 2016-03-09 DIAGNOSIS — F909 Attention-deficit hyperactivity disorder, unspecified type: Secondary | ICD-10-CM | POA: Insufficient documentation

## 2016-03-09 DIAGNOSIS — R519 Headache, unspecified: Secondary | ICD-10-CM

## 2016-03-09 DIAGNOSIS — F1290 Cannabis use, unspecified, uncomplicated: Secondary | ICD-10-CM | POA: Insufficient documentation

## 2016-03-09 DIAGNOSIS — J45909 Unspecified asthma, uncomplicated: Secondary | ICD-10-CM | POA: Insufficient documentation

## 2016-03-09 DIAGNOSIS — R51 Headache: Secondary | ICD-10-CM | POA: Insufficient documentation

## 2016-03-09 DIAGNOSIS — F1490 Cocaine use, unspecified, uncomplicated: Secondary | ICD-10-CM | POA: Insufficient documentation

## 2016-03-09 DIAGNOSIS — Z79899 Other long term (current) drug therapy: Secondary | ICD-10-CM | POA: Insufficient documentation

## 2016-03-09 LAB — BASIC METABOLIC PANEL
ANION GAP: 9 (ref 5–15)
BUN: 17 mg/dL (ref 6–20)
CO2: 28 mmol/L (ref 22–32)
Calcium: 9.5 mg/dL (ref 8.9–10.3)
Chloride: 100 mmol/L — ABNORMAL LOW (ref 101–111)
Creatinine, Ser: 0.65 mg/dL (ref 0.44–1.00)
GFR calc Af Amer: >60 mL/min (ref >60)
Glucose, Bld: 83 mg/dL (ref 65–99)
POTASSIUM: 3.6 mmol/L (ref 3.5–5.1)
SODIUM: 137 mmol/L (ref 135–145)

## 2016-03-09 LAB — CBC
HCT: 37.5 % (ref 35.0–47.0)
Hemoglobin: 12.6 g/dL (ref 12.0–16.0)
MCH: 28.6 pg (ref 26.0–34.0)
MCHC: 33.7 g/dL (ref 32.0–36.0)
MCV: 85 fL (ref 80.0–100.0)
PLATELETS: 320 10*3/uL (ref 150–440)
RBC: 4.41 MIL/uL (ref 3.80–5.20)
RDW: 14.5 % (ref 11.5–14.5)
WBC: 12.9 10*3/uL — ABNORMAL HIGH (ref 3.6–11.0)

## 2016-03-09 LAB — URINALYSIS, COMPLETE (UACMP) WITH MICROSCOPIC
Bilirubin Urine: NEGATIVE
Glucose, UA: NEGATIVE mg/dL
KETONES UR: NEGATIVE mg/dL
Nitrite: NEGATIVE
PH: 6 (ref 5.0–8.0)
Protein, ur: NEGATIVE mg/dL
SPECIFIC GRAVITY, URINE: 1.006 (ref 1.005–1.030)

## 2016-03-09 MED ORDER — SODIUM CHLORIDE 0.9 % IV BOLUS (SEPSIS)
1000.0000 mL | Freq: Once | INTRAVENOUS | Status: AC
  Administered 2016-03-09: 1000 mL via INTRAVENOUS

## 2016-03-09 MED ORDER — BUTALBITAL-APAP-CAFFEINE 50-325-40 MG PO TABS: 1.0000 | ORAL_TABLET | Freq: Four times a day (QID) | ORAL | 0 refills | Status: AC | PRN

## 2016-03-09 MED ORDER — METOCLOPRAMIDE HCL 5 MG/ML IJ SOLN
10.0000 mg | Freq: Once | INTRAMUSCULAR | Status: AC
  Administered 2016-03-09: 10 mg via INTRAVENOUS
  Filled 2016-03-09: qty 2

## 2016-03-09 MED ORDER — KETOROLAC TROMETHAMINE 30 MG/ML IJ SOLN
30.0000 mg | Freq: Once | INTRAMUSCULAR | Status: AC
  Administered 2016-03-09: 30 mg via INTRAVENOUS
  Filled 2016-03-09: qty 1

## 2016-03-09 MED ORDER — DIPHENHYDRAMINE HCL 50 MG/ML IJ SOLN
25.0000 mg | Freq: Once | INTRAMUSCULAR | Status: AC
  Administered 2016-03-09: 25 mg via INTRAVENOUS
  Filled 2016-03-09: qty 1

## 2016-03-09 MED ORDER — TRAMADOL HCL 50 MG PO TABS: 50.0000 mg | ORAL_TABLET | Freq: Four times a day (QID) | ORAL | 0 refills | Status: DC | PRN

## 2016-03-09 MED ORDER — TRAMADOL HCL 50 MG PO TABS
50.0000 mg | ORAL_TABLET | Freq: Once | ORAL | Status: AC
  Administered 2016-03-09: 50 mg via ORAL
  Filled 2016-03-09: qty 1

## 2016-03-09 NOTE — Discharge Instructions (Signed)
Please follow up with the acute care clinic. Please return with worsening symptoms.

## 2016-03-09 NOTE — ED Notes (Signed)
Pt reports having back surgery on Dec. 2nd, since that time pt reports having incontinent episodes with urine. Pt states she is not sure if the incontinence is from the back surgery or from possible UTI.

## 2016-03-09 NOTE — ED Triage Notes (Signed)
Pt brought in by EMS with pt reporting headache for the last 2 days. Pt also reports "kidneys hurting and dark urine." Pt A&O at this time.

## 2016-03-09 NOTE — ED Provider Notes (Signed)
[facility] Emergency Department Provider Note   ____________________________________________   First MD Initiated Contact with Patient 03/09/16 [phone]     (approximate)  I have reviewed the triage vital signs and the nursing notes.   HISTORY  Chief Complaint Headache    HPI Tammy Hickman is a 26 y.o. female who comes into the hospital today with a headache over the last 2 days. She is also has some pain in her back with a history of kidney infections. She reports to hurts more on the left than the right. She's had this back discomfort for 4 days. She's had some dark urine with a foul-smelling as well. The patient has a cough and is having some dizziness and double vision. She is also urinated on herself a few times which she reports she's had since her back surgery. She is been taking Tylenol and Advil for her symptoms but has not been helping. The patient also has some nausea and all she wants to do is sleep. She denies any fever but has been light sensitive. She denies having a headache like this in the past but reports that she has had some headaches with her UTIs. Her last menstrual. Was one week ago. She is here today for evaluation.   Past Medical History:  Diagnosis Date  . Anxiety   . Asthma   . Bipolar disorder (HCC)   . Hernia, umbilical 2011  . Mental disorders of mother, antepartum   . Obesity complicating pregnancy, childbirth, or the puerperium, antepartum condition or complication(649.13)   . Other specified indication for care or intervention related to labor and delivery, antepartum   . Thyroid dysfunction     Patient Active Problem List   Diagnosis Date Noted  . Pyelonephritis 12/08/2015  . Cocaine abuse   . Elevated troponin I level   . Leg swelling   . NSTEMI (non-ST elevated myocardial infarction) (HCC) 11/03/2015  . Low back pain 01/13/2015  . Leg pain, right 01/13/2015  . Opiate overdose 10/31/2014  . Dysthymia  10/31/2014  . ADHD (attention deficit hyperactivity disorder) 10/31/2014  . Obesity 07/27/2011  . Contraception, generic surveillance 07/27/2011    Past Surgical History:  Procedure Laterality Date  . ABDOMINAL SURGERY    . BACK SURGERY    . HERNIA REPAIR     umbilical hernia  . TONSILLECTOMY  2009    Prior to Admission medications   Medication Sig Start Date End Date Taking? Authorizing Provider  acetaminophen (TYLENOL) 325 MG tablet Take 2 tablets (650 mg total) by mouth every 6 (six) hours as needed for moderate pain. 10/09/15   Jami L Hagler, PA-C  amphetamine-dextroamphetamine (ADDERALL XR) 15 MG 24 hr capsule Take 15 mg by mouth 2 (two) times daily.    Historical Provider, MD  amphetamine-dextroamphetamine (ADDERALL) 30 MG tablet Take 30 mg by mouth 2 (two) times daily.    Historical Provider, MD  butalbital-acetaminophen-caffeine (FIORICET, ESGIC) [phone] MG tablet Take 1-2 tablets by mouth every 6 (six) hours as needed for headache. 03/09/16 03/09/17  Rebecka Apley, MD  cefUROXime (CEFTIN) 500 MG tablet Take 1 tablet (500 mg total) by mouth 2 (two) times daily with a meal. 12/09/15   Adrian Saran, MD  clonazePAM (KLONOPIN) 1 MG tablet Take 1 tablet (1 mg total) by mouth 2 (two) times daily. 12/09/15   Adrian Saran, MD  cyclobenzaprine (FLEXERIL) 10 MG tablet Take 1 tablet (10 mg total) by mouth 3 (three) times daily as needed for  muscle spasms. 11/04/15   Milagros LollSrikar Sudini, MD  traMADol (ULTRAM) 50 MG tablet Take 1 tablet (50 mg total) by mouth every 6 (six) hours as needed. 03/09/16   Rebecka ApleyAllison P Jesenya Bowditch, MD    Allergies Fentanyl; Meloxicam; Penicillins; Prednisone; and Hydromorphone  Family History  Problem Relation Age of Onset  . Cancer Paternal Grandfather     Esophageal  . Heart disease Paternal Grandmother   . Diabetes Maternal Grandmother   . Heart disease Maternal Grandfather   . Diabetes Maternal Grandfather   . Cancer Maternal Grandfather     Esophageal  . Heart  disease Maternal Aunt     Fatal MI in late-30's    Social History Social History  Substance Use Topics  . Smoking status: Light Tobacco Smoker    Packs/day: 0.20    Last attempt to quit: 11/14/2009  . Smokeless tobacco: Never Used  . Alcohol use No    Review of Systems Constitutional: No fever/chills Eyes: No visual changes. ENT: No sore throat. Cardiovascular: Denies chest pain. Respiratory: Denies shortness of breath. Gastrointestinal: No abdominal pain.  No nausea, no vomiting.  No diarrhea.  No constipation. Genitourinary:Dark urine and foul-smelling urine Musculoskeletal: back pain. Skin: Negative for rash. Neurological: Headache  10-point ROS otherwise negative.  ____________________________________________   PHYSICAL EXAM:  VITAL SIGNS: ED Triage Vitals  Enc Vitals Group     BP 03/09/16 0520 138/84     Pulse Rate 03/09/16 0520 73     Resp 03/09/16 0520 18     Temp 03/09/16 0520 97.8 F (36.6 C)     Temp Source 03/09/16 0520 Oral     SpO2 03/09/16 0520 100 %     Weight 03/09/16 0521 250 lb (113.4 kg)     Height 03/09/16 0521 5\' 7"  (1.702 m)     Head Circumference --      Peak Flow --      Pain Score 03/09/16 0521 10     Pain Loc --      Pain Edu? --      Excl. in GC? --     Constitutional: Alert and oriented. Well appearing and in Moderate distress. Eyes: Conjunctivae are normal. PERRL. EOMI. Head: Atraumatic. Nose: No congestion/rhinnorhea. Mouth/Throat: Mucous membranes are moist.  Oropharynx non-erythematous. Cardiovascular: Normal rate, regular rhythm. Grossly normal heart sounds.  Good peripheral circulation. Respiratory: Normal respiratory effort.  No retractions. Lungs CTAB. Gastrointestinal: Soft and nontender. No distention. Positive bowel sounds bilateral CVA tenderness to palpation Musculoskeletal: No lower extremity tenderness nor edema.   Neurologic:  Normal speech and language. Cranial nerves II through XII are grossly intact with no  focal motor or neuro deficits Skin:  Skin is warm, dry and intact.  Psychiatric: Mood and affect are normal.   ____________________________________________   LABS (all labs ordered are listed, but only abnormal results are displayed)  Labs Reviewed  CBC - Abnormal; Notable for the following:       Result Value   WBC 12.9 (*)    All other components within normal limits  BASIC METABOLIC PANEL - Abnormal; Notable for the following:    Chloride 100 (*)    All other components within normal limits  URINALYSIS, COMPLETE (UACMP) WITH MICROSCOPIC - Abnormal; Notable for the following:    Color, Urine STRAW (*)    APPearance CLEAR (*)    Hgb urine dipstick SMALL (*)    Leukocytes, UA LARGE (*)    Bacteria, UA RARE (*)    Squamous Epithelial /  LPF 0-5 (*)    All other components within normal limits   ____________________________________________  EKG  none ____________________________________________  RADIOLOGY  US doppler LLE ____________________________________________   PROCEDURES  Procedure(s) performed: None  Procedures  Critical Care performed: No  ____________________________________________   INITIAL IMPRESSION / ASSESSMENT AND PLAN / ED COURSE  Pertinent labs & imaging results that were available during my care of the patient were reviewed by me and considered in my medical decision making (see chart for details).  This is a 26 year old female who comes into the hospital today with some headache, back pain and some swelling to her left lower extremity. I will check some blood work to include a CBC, BMP and a urinalysis. I will give the patient a dose of Reglan, Benadryl, Toradol and a liter of normal saline. I will also send the patient for a Doppler of her left lower extremity looking for possible DVT. I will reassess the patient once I received her blood work.  Clinical Course as of Mar 09 830  Tue Mar 09, 2016  5621 No evidence of deep venous thrombosis.  US Venous Img Lower Unilateral Left [AW]    Clinical Course User Index [AW] Rebecka Apley, MD    The patient's blood work is unremarkable and her urinalysis is unremarkable. After the medication the patient reports her headache is improved. She states she still had some back pains like it were dose of tramadol. She'll be discharged home to follow-up with the acute care clinic. The patient otherwise has no further complaints or concerns. ____________________________________________   FINAL CLINICAL IMPRESSION(S) / ED DIAGNOSES  Final diagnoses:  Nonintractable headache, unspecified chronicity pattern, unspecified headache type  Acute bilateral low back pain without sciatica      NEW MEDICATIONS STARTED DURING THIS VISIT:  New Prescriptions   BUTALBITAL-ACETAMINOPHEN-CAFFEINE (FIORICET, ESGIC) 50-325-40 MG TABLET    Take 1-2 tablets by mouth every 6 (six) hours as needed for headache.   TRAMADOL (ULTRAM) 50 MG TABLET    Take 1 tablet (50 mg total) by mouth every 6 (six) hours as needed.     Note:  This document was prepared using Dragon voice recognition software and may include unintentional dictation errors.    Rebecka Apley, MD 03/09/16 [phone]

## 2016-06-08 ENCOUNTER — Emergency Department: Payer: Self-pay

## 2016-06-08 ENCOUNTER — Emergency Department
Admission: EM | Admit: 2016-06-08 | Discharge: 2016-06-08 | Disposition: A | Discharge status: Home / Self Care | Payer: Self-pay | Attending: Student in an Organized Health Care Education/Training Program | Admitting: Student in an Organized Health Care Education/Training Program

## 2016-06-08 ENCOUNTER — Encounter: Payer: Self-pay | Admitting: Emergency Medicine

## 2016-06-08 DIAGNOSIS — W010XXA Fall on same level from slipping, tripping and stumbling without subsequent striking against object, initial encounter: Secondary | ICD-10-CM | POA: Insufficient documentation

## 2016-06-08 DIAGNOSIS — Y939 Activity, unspecified: Secondary | ICD-10-CM | POA: Insufficient documentation

## 2016-06-08 DIAGNOSIS — M545 Low back pain, unspecified: Secondary | ICD-10-CM

## 2016-06-08 DIAGNOSIS — M25511 Pain in right shoulder: Secondary | ICD-10-CM | POA: Insufficient documentation

## 2016-06-08 DIAGNOSIS — F909 Attention-deficit hyperactivity disorder, unspecified type: Secondary | ICD-10-CM | POA: Insufficient documentation

## 2016-06-08 DIAGNOSIS — F17200 Nicotine dependence, unspecified, uncomplicated: Secondary | ICD-10-CM | POA: Insufficient documentation

## 2016-06-08 DIAGNOSIS — J45909 Unspecified asthma, uncomplicated: Secondary | ICD-10-CM | POA: Insufficient documentation

## 2016-06-08 DIAGNOSIS — Y990 Civilian activity done for income or pay: Secondary | ICD-10-CM | POA: Insufficient documentation

## 2016-06-08 DIAGNOSIS — Z791 Long term (current) use of non-steroidal anti-inflammatories (NSAID): Secondary | ICD-10-CM | POA: Insufficient documentation

## 2016-06-08 DIAGNOSIS — Z79899 Other long term (current) drug therapy: Secondary | ICD-10-CM | POA: Insufficient documentation

## 2016-06-08 DIAGNOSIS — Y929 Unspecified place or not applicable: Secondary | ICD-10-CM | POA: Insufficient documentation

## 2016-06-08 LAB — POCT PREGNANCY, URINE: PREG TEST UR: NEGATIVE

## 2016-06-08 MED ORDER — HYDROMORPHONE HCL 1 MG/ML IJ SOLN
1.0000 mg | Freq: Once | INTRAMUSCULAR | Status: AC
  Administered 2016-06-08: 1 mg via INTRAMUSCULAR
  Filled 2016-06-08: qty 1

## 2016-06-08 MED ORDER — CYCLOBENZAPRINE HCL 10 MG PO TABS: 10.0000 mg | ORAL_TABLET | Freq: Three times a day (TID) | ORAL | 0 refills | Status: DC | PRN

## 2016-06-08 MED ORDER — ORPHENADRINE CITRATE 30 MG/ML IJ SOLN
60.0000 mg | Freq: Two times a day (BID) | INTRAMUSCULAR | Status: DC
  Administered 2016-06-08: 60 mg via INTRAMUSCULAR
  Filled 2016-06-08: qty 2

## 2016-06-08 MED ORDER — TRAMADOL HCL 50 MG PO TABS: 50.0000 mg | ORAL_TABLET | Freq: Four times a day (QID) | ORAL | 0 refills | Status: DC | PRN

## 2016-06-08 MED ORDER — IBUPROFEN 600 MG PO TABS: 600.0000 mg | ORAL_TABLET | Freq: Three times a day (TID) | ORAL | 0 refills | Status: DC | PRN

## 2016-06-08 NOTE — ED Triage Notes (Signed)
Pt to ed with c/o fall yesterday at work,  States she slipped on vegetable piece and fell backwards onto back and right shoulder.  Pt reports increased pain with movement of right arm.

## 2016-06-08 NOTE — ED Provider Notes (Signed)
[facility] Emergency Department Provider Note   ____________________________________________   First MD Initiated Contact with Patient 06/08/16 0848     (approximate)  I have reviewed the triage vital signs and the nursing notes.   HISTORY  Chief Complaint Fall   HPI Tammy Hickman is a 26 y.o. female Patient complaining a pain posterior shoulder and back pain secondary to a slip and fall yesterday. Patient stated pain increases with movement of the arm in flexion of the back. Patient denies any radicular component to her back pain. Patient denies bladder or bowel dysfunction. No palliative measures for her complaint. Past Medical History:  Diagnosis Date  . Anxiety   . Asthma   . Bipolar disorder (HCC)   . Hernia, umbilical 2011  . Mental disorders of mother, antepartum   . Obesity complicating pregnancy, childbirth, or the puerperium, antepartum condition or complication(649.13)   . Other specified indication for care or intervention related to labor and delivery, antepartum   . Thyroid dysfunction     Patient Active Problem List   Diagnosis Date Noted  . Pyelonephritis 12/08/2015  . Cocaine abuse   . Elevated troponin I level   . Leg swelling   . NSTEMI (non-ST elevated myocardial infarction) (HCC) 11/03/2015  . Low back pain 01/13/2015  . Leg pain, right 01/13/2015  . Opiate overdose 10/31/2014  . Dysthymia 10/31/2014  . ADHD (attention deficit hyperactivity disorder) 10/31/2014  . Obesity 07/27/2011  . Contraception, generic surveillance 07/27/2011    Past Surgical History:  Procedure Laterality Date  . ABDOMINAL SURGERY    . BACK SURGERY    . HERNIA REPAIR     umbilical hernia  . TONSILLECTOMY  2009    Prior to Admission medications   Medication Sig Start Date End Date Taking? Authorizing Provider  acetaminophen (TYLENOL) 325 MG tablet Take 2 tablets (650 mg total) by mouth every 6 (six) hours as needed for moderate  pain. 10/09/15   Jami L Hagler, PA-C  amphetamine-dextroamphetamine (ADDERALL XR) 15 MG 24 hr capsule Take 15 mg by mouth 2 (two) times daily.    Historical Provider, MD  amphetamine-dextroamphetamine (ADDERALL) 30 MG tablet Take 30 mg by mouth 2 (two) times daily.    Historical Provider, MD  butalbital-acetaminophen-caffeine (FIORICET, ESGIC) [phone] MG tablet Take 1-2 tablets by mouth every 6 (six) hours as needed for headache. 03/09/16 03/09/17  Rebecka Apley, MD  cefUROXime (CEFTIN) 500 MG tablet Take 1 tablet (500 mg total) by mouth 2 (two) times daily with a meal. 12/09/15   Adrian Saran, MD  clonazePAM (KLONOPIN) 1 MG tablet Take 1 tablet (1 mg total) by mouth 2 (two) times daily. 12/09/15   Adrian Saran, MD  cyclobenzaprine (FLEXERIL) 10 MG tablet Take 1 tablet (10 mg total) by mouth 3 (three) times daily as needed for muscle spasms. 11/04/15   Srikar Sudini, MD  cyclobenzaprine (FLEXERIL) 10 MG tablet Take 1 tablet (10 mg total) by mouth 3 (three) times daily as needed. 06/08/16   Joni Reining, PA-C  ibuprofen (ADVIL,MOTRIN) 600 MG tablet Take 1 tablet (600 mg total) by mouth every 8 (eight) hours as needed. 06/08/16   Joni Reining, PA-C  traMADol (ULTRAM) 50 MG tablet Take 1 tablet (50 mg total) by mouth every 6 (six) hours as needed. 03/09/16   Rebecka Apley, MD  traMADol (ULTRAM) 50 MG tablet Take 1 tablet (50 mg total) by mouth every 6 (six) hours as needed for moderate pain. 06/08/16  Joni Reining, PA-C    Allergies Fentanyl; Meloxicam; Penicillins; Prednisone; and Hydromorphone  Family History  Problem Relation Age of Onset  . Cancer Paternal Grandfather     Esophageal  . Heart disease Paternal Grandmother   . Diabetes Maternal Grandmother   . Heart disease Maternal Grandfather   . Diabetes Maternal Grandfather   . Cancer Maternal Grandfather     Esophageal  . Heart disease Maternal Aunt     Fatal MI in late-30's    Social History Social History  Substance Use  Topics  . Smoking status: Light Tobacco Smoker    Packs/day: 0.20    Last attempt to quit: 11/14/2009  . Smokeless tobacco: Never Used  . Alcohol use No    Review of Systems Constitutional: No fever/chills Eyes: No visual changes. ENT: No sore throat. Cardiovascular: Denies chest pain. Respiratory: Denies shortness of breath. Gastrointestinal: No abdominal pain.  No nausea, no vomiting.  No diarrhea.  No constipation. Genitourinary: Negative for dysuria. Musculoskeletal: Chronic back pain Skin: Negative for rash. Neurological: Negative for headaches, focal weakness or numbness. Psychiatric:Anxiety, ADHD, cocaine abuse. Allergic/Immunilogical: See medication list ____________________________________________   PHYSICAL EXAM:  VITAL SIGNS: ED Triage Vitals  Enc Vitals Group     BP 06/08/16 0804 (!) 135/58     Pulse Rate 06/08/16 0804 (!) 101     Resp 06/08/16 0804 16     Temp 06/08/16 0804 98.2 F (36.8 C)     Temp Source 06/08/16 0804 Oral     SpO2 06/08/16 0804 98 %     Weight 06/08/16 0804 250 lb (113.4 kg)     Height --      Head Circumference --      Peak Flow --      Pain Score 06/08/16 0803 9     Pain Loc --      Pain Edu? --      Excl. in GC? --     Constitutional: Alert and oriented. Well appearing and in no acute distress. Eyes: Conjunctivae are normal. PERRL. EOMI. Head: Atraumatic. Nose: No congestion/rhinnorhea. Mouth/Throat: Mucous membranes are moist.  Oropharynx non-erythematous. Neck: No stridor.  No cervical spine tenderness to palpation. Hematological/Lymphatic/Immunilogical: No cervical lymphadenopathy. Cardiovascular: Normal rate, regular rhythm. Grossly normal heart sounds.  Good peripheral circulation. Respiratory: Normal respiratory effort.  No retractions. Lungs CTAB. Gastrointestinal: Soft and nontender. No distention. No abdominal bruits. No CVA tenderness. Musculoskeletal: No obvious deformity to the right upper extremity or lumbar  spine. Patient decreased range of motion with adduction of the right upper extremity. Patient decreased range of motion with extension and lateral movements of the back. Patient State leg test.  Neurologic:  Normal speech and language. No gross focal neurologic deficits are appreciated. No gait instability. Skin:  Skin is warm, dry and intact. No rash noted. Surgical scar consistent with history of back surgery. Psychiatric: Mood and affect are normal. Speech and behavior are normal.  ____________________________________________   LABS (all labs ordered are listed, but only abnormal results are displayed)  Labs Reviewed  POC URINE PREG, ED  POCT PREGNANCY, URINE   ____________________________________________  EKG   ____________________________________________  RADIOLOGY   ____________________________________________   PROCEDURES  Procedure(s) performed: None  Procedures  Critical Care performed: No  ____________________________________________   INITIAL IMPRESSION / ASSESSMENT AND PLAN / ED COURSE  Pertinent labs & imaging results that were available during my care of the patient were reviewed by me and considered in my medical decision making (see chart  for details).  Right shoulder and low back pain secondary to a slip and fall. X-rays reveals no acute findings. Patient states pain relief with Dilaudid and Norflex.    ____________________________________________   FINAL CLINICAL IMPRESSION(S) / ED DIAGNOSES  Final diagnoses:  Acute pain of right shoulder  Back pain at L4-L5 level   Patient given discharge Instructions no work no. Patient [facility].   NEW MEDICATIONS STARTED DURING THIS VISIT:  New Prescriptions   CYCLOBENZAPRINE (FLEXERIL) 10 MG TABLET    Take 1 tablet (10 mg total) by mouth 3 (three) times daily as needed.   IBUPROFEN (ADVIL,MOTRIN) 600 MG TABLET    Take 1 tablet (600 mg total) by  mouth every 8 (eight) hours as needed.   TRAMADOL (ULTRAM) 50 MG TABLET    Take 1 tablet (50 mg total) by mouth every 6 (six) hours as needed for moderate pain.     Note:  This document was prepared using Dragon voice recognition software and may include unintentional dictation errors.    Joni Reining, PA-C 06/08/16 1000    Willy Eddy, MD 06/08/16 [phone]

## 2016-12-06 ENCOUNTER — Emergency Department
Admission: EM | Admit: 2016-12-06 | Discharge: 2016-12-06 | Disposition: A | Discharge status: Home / Self Care | Payer: Self-pay | Attending: Emergency Medicine | Admitting: Emergency Medicine

## 2016-12-06 ENCOUNTER — Encounter: Payer: Self-pay | Admitting: Emergency Medicine

## 2016-12-06 ENCOUNTER — Emergency Department: Payer: Self-pay

## 2016-12-06 DIAGNOSIS — Z3201 Encounter for pregnancy test, result positive: Secondary | ICD-10-CM | POA: Insufficient documentation

## 2016-12-06 DIAGNOSIS — O2341 Unspecified infection of urinary tract in pregnancy, first trimester: Secondary | ICD-10-CM | POA: Insufficient documentation

## 2016-12-06 DIAGNOSIS — Z3A01 Less than 8 weeks gestation of pregnancy: Secondary | ICD-10-CM | POA: Insufficient documentation

## 2016-12-06 DIAGNOSIS — O99511 Diseases of the respiratory system complicating pregnancy, first trimester: Secondary | ICD-10-CM | POA: Insufficient documentation

## 2016-12-06 DIAGNOSIS — F17290 Nicotine dependence, other tobacco product, uncomplicated: Secondary | ICD-10-CM | POA: Insufficient documentation

## 2016-12-06 DIAGNOSIS — Z79899 Other long term (current) drug therapy: Secondary | ICD-10-CM | POA: Insufficient documentation

## 2016-12-06 DIAGNOSIS — O99331 Smoking (tobacco) complicating pregnancy, first trimester: Secondary | ICD-10-CM | POA: Insufficient documentation

## 2016-12-06 DIAGNOSIS — N390 Urinary tract infection, site not specified: Secondary | ICD-10-CM

## 2016-12-06 DIAGNOSIS — I252 Old myocardial infarction: Secondary | ICD-10-CM | POA: Insufficient documentation

## 2016-12-06 DIAGNOSIS — Z880 Allergy status to penicillin: Secondary | ICD-10-CM | POA: Insufficient documentation

## 2016-12-06 DIAGNOSIS — Z885 Allergy status to narcotic agent status: Secondary | ICD-10-CM | POA: Insufficient documentation

## 2016-12-06 DIAGNOSIS — J45909 Unspecified asthma, uncomplicated: Secondary | ICD-10-CM | POA: Insufficient documentation

## 2016-12-06 LAB — URINALYSIS, COMPLETE (UACMP) WITH MICROSCOPIC
BILIRUBIN URINE: NEGATIVE
Bacteria, UA: NONE SEEN
GLUCOSE, UA: NEGATIVE mg/dL
HGB URINE DIPSTICK: NEGATIVE
Ketones, ur: 80 mg/dL — AB
NITRITE: NEGATIVE
PROTEIN: 30 mg/dL — AB
SPECIFIC GRAVITY, URINE: 1.024 (ref 1.005–1.030)
pH: 6 (ref 5.0–8.0)

## 2016-12-06 LAB — CBC
HEMATOCRIT: 42.9 % (ref 35.0–47.0)
HEMOGLOBIN: 14.2 g/dL (ref 12.0–16.0)
MCH: 30.1 pg (ref 26.0–34.0)
MCHC: 33.1 g/dL (ref 32.0–36.0)
MCV: 91.2 fL (ref 80.0–100.0)
Platelets: 272 10*3/uL (ref 150–440)
RBC: 4.7 MIL/uL (ref 3.80–5.20)
RDW: 14.8 % — ABNORMAL HIGH (ref 11.5–14.5)
WBC: 8.5 10*3/uL (ref 3.6–11.0)

## 2016-12-06 LAB — COMPREHENSIVE METABOLIC PANEL
ALBUMIN: 4.1 g/dL (ref 3.5–5.0)
ALT: 21 U/L (ref 14–54)
AST: 24 U/L (ref 15–41)
Alkaline Phosphatase: 56 U/L (ref 38–126)
Anion gap: 10 (ref 5–15)
BUN: 13 mg/dL (ref 6–20)
CALCIUM: 9.1 mg/dL (ref 8.9–10.3)
CHLORIDE: 102 mmol/L (ref 101–111)
CO2: 25 mmol/L (ref 22–32)
Creatinine, Ser: 0.84 mg/dL (ref 0.44–1.00)
GFR calc Af Amer: >60 mL/min (ref >60)
GFR calc non Af Amer: >60 mL/min (ref >60)
GLUCOSE: 79 mg/dL (ref 65–99)
Potassium: 3.7 mmol/L (ref 3.5–5.1)
SODIUM: 137 mmol/L (ref 135–145)
TOTAL PROTEIN: 7.7 g/dL (ref 6.5–8.1)
Total Bilirubin: 1 mg/dL (ref 0.3–1.2)

## 2016-12-06 LAB — LIPASE, BLOOD: LIPASE: 23 U/L (ref 11–51)

## 2016-12-06 LAB — HCG, QUANTITATIVE, PREGNANCY: hCG, Beta Chain, Quant, S: 4006 m[IU]/mL — ABNORMAL HIGH (ref <5)

## 2016-12-06 LAB — POCT PREGNANCY, URINE: Preg Test, Ur: POSITIVE — AB

## 2016-12-06 LAB — ABO/RH: ABO/RH(D): O POS

## 2016-12-06 MED ORDER — CEPHALEXIN 500 MG PO CAPS: 500.0000 mg | ORAL_CAPSULE | Freq: Two times a day (BID) | ORAL | 0 refills | Status: DC

## 2016-12-06 NOTE — ED Provider Notes (Signed)
[facility] Emergency Department Provider Note   ____________________________________________    I have reviewed the triage vital signs and the nursing notes.   HISTORY  Chief Complaint Abdominal Pain     HPI Tammy Hickman is a 26 y.o. female Who presents with complaints of lower abdominal cramping. Patient reports she felt that she was pregnant 2 days ago. She denies vaginal bleeding today. She denies vaginal discharge. Does report some mild dysuria. No fevers or chills. No back pain. No nausea or vomiting. Normal stools   Past Medical History:  Diagnosis Date  . Anxiety   . Asthma   . Bipolar disorder (HCC)   . Hernia, umbilical 2011  . Mental disorders of mother, antepartum   . Obesity complicating pregnancy, childbirth, or the puerperium, antepartum condition or complication(649.13)   . Other specified indication for care or intervention related to labor and delivery, antepartum   . Thyroid dysfunction     Patient Active Problem List   Diagnosis Date Noted  . Pyelonephritis 12/08/2015  . Cocaine abuse (HCC)   . Elevated troponin I level   . Leg swelling   . NSTEMI (non-ST elevated myocardial infarction) (HCC) 11/03/2015  . Low back pain 01/13/2015  . Leg pain, right 01/13/2015  . Opiate overdose (HCC) 10/31/2014  . Dysthymia 10/31/2014  . ADHD (attention deficit hyperactivity disorder) 10/31/2014  . Obesity 07/27/2011  . Contraception, generic surveillance 07/27/2011    Past Surgical History:  Procedure Laterality Date  . ABDOMINAL SURGERY    . BACK SURGERY    . HERNIA REPAIR     umbilical hernia  . TONSILLECTOMY  2009    Prior to Admission medications   Medication Sig Start Date End Date Taking? Authorizing Provider  acetaminophen (TYLENOL) 325 MG tablet Take 2 tablets (650 mg total) by mouth every 6 (six) hours as needed for moderate pain. 10/09/15   Hagler, Jami L, PA-C  butalbital-acetaminophen-caffeine (FIORICET,  ESGIC) [phone] MG tablet Take 1-2 tablets by mouth every 6 (six) hours as needed for headache. 03/09/16 03/09/17  Rebecka Apley, MD  cefUROXime (CEFTIN) 500 MG tablet Take 1 tablet (500 mg total) by mouth 2 (two) times daily with a meal. Patient not taking: Reported on 12/06/2016 12/09/15   Adrian Saran, MD  cephALEXin (KEFLEX) 500 MG capsule Take 1 capsule (500 mg total) by mouth 2 (two) times daily. 12/06/16   Jene Every, MD  clonazePAM (KLONOPIN) 1 MG tablet Take 1 tablet (1 mg total) by mouth 2 (two) times daily. Patient not taking: Reported on 12/06/2016 12/09/15   Adrian Saran, MD  cyclobenzaprine (FLEXERIL) 10 MG tablet Take 1 tablet (10 mg total) by mouth 3 (three) times daily as needed for muscle spasms. Patient not taking: Reported on 12/06/2016 11/04/15   Milagros Loll, MD  cyclobenzaprine (FLEXERIL) 10 MG tablet Take 1 tablet (10 mg total) by mouth 3 (three) times daily as needed. Patient not taking: Reported on 12/06/2016 06/08/16   Joni Reining, PA-C  ibuprofen (ADVIL,MOTRIN) 600 MG tablet Take 1 tablet (600 mg total) by mouth every 8 (eight) hours as needed. Patient not taking: Reported on 12/06/2016 06/08/16   Joni Reining, PA-C  traMADol (ULTRAM) 50 MG tablet Take 1 tablet (50 mg total) by mouth every 6 (six) hours as needed. Patient not taking: Reported on 12/06/2016 03/09/16   Rebecka Apley, MD  traMADol (ULTRAM) 50 MG tablet Take 1 tablet (50 mg total) by mouth every 6 (six) hours as  needed for moderate pain. Patient not taking: Reported on 12/06/2016 06/08/16   Joni Reining, PA-C     Allergies Fentanyl; Meloxicam; Penicillins; Prednisone; and Hydromorphone  Family History  Problem Relation Age of Onset  . Cancer Paternal Grandfather        Esophageal  . Heart disease Paternal Grandmother   . Diabetes Maternal Grandmother   . Heart disease Maternal Grandfather   . Diabetes Maternal Grandfather   . Cancer Maternal Grandfather        Esophageal  .  Heart disease Maternal Aunt        Fatal MI in late-30's    Social History Social History  Substance Use Topics  . Smoking status: Light Tobacco Smoker    Packs/day: 0.20    Last attempt to quit: 11/14/2009  . Smokeless tobacco: Never Used  . Alcohol use No    Review of Systems  Constitutional: No fever/chills Eyes: No visual changes.  ENT: No sore throat. Cardiovascular: Denies chest pain. Respiratory: Denies shortness of breath. Gastrointestinal: as above Genitourinary: as above Musculoskeletal: Negative for back pain. Skin: Negative for rash. Neurological: Negative for headaches   ____________________________________________   PHYSICAL EXAM:  VITAL SIGNS: ED Triage Vitals  Enc Vitals Group     BP 12/06/16 0852 (!) 143/79     Pulse Rate 12/06/16 0852 94     Resp 12/06/16 0852 16     Temp 12/06/16 0852 98.6 F (37 C)     Temp Source 12/06/16 0852 Oral     SpO2 12/06/16 0852 97 %     Weight 12/06/16 0849 108.9 kg (240 lb)     Height 12/06/16 0849 1.727 m ( )     Head Circumference --      Peak Flow --      Pain Score 12/06/16 0849 7     Pain Loc --      Pain Edu? --      Excl. in GC? --     Constitutional: Alert and oriented. No acute distress. Pleasant and interactive Eyes: Conjunctivae are normal.   Nose: No congestion/rhinnorhea. Mouth/Throat: Mucous membranes are moist.    Cardiovascular: Normal rate, regular rhythm. Grossly normal heart sounds.  Good peripheral circulation. Respiratory: Normal respiratory effort.  No retractions. . Gastrointestinal: Soft and nontender. No distention.  No CVA tenderness. Genitourinary: deferred per patient request Musculoskeletal: No lower extremity tenderness nor edema.  Warm and well perfused Neurologic:  Normal speech and language. No gross focal neurologic deficits are appreciated.  Skin:  Skin is warm, dry and intact. No rash noted. Psychiatric: Mood and affect are normal. Speech and behavior are  normal.  ____________________________________________   LABS (all labs ordered are listed, but only abnormal results are displayed)  Labs Reviewed  CBC - Abnormal; Notable for the following:       Result Value   RDW 14.8 (*)    All other components within normal limits  URINALYSIS, COMPLETE (UACMP) WITH MICROSCOPIC - Abnormal; Notable for the following:    Color, Urine AMBER (*)    APPearance CLOUDY (*)    Ketones, ur 80 (*)    Protein, ur 30 (*)    Leukocytes, UA MODERATE (*)    Squamous Epithelial / LPF TOO NUMEROUS TO COUNT (*)    All other components within normal limits  HCG, QUANTITATIVE, PREGNANCY - Abnormal; Notable for the following:    hCG, Beta Chain, Quant, S 4,006 (*)    All other components within normal limits  POCT PREGNANCY, URINE - Abnormal; Notable for the following:    Preg Test, Ur POSITIVE (*)    All other components within normal limits  LIPASE, BLOOD  COMPREHENSIVE METABOLIC PANEL  POC URINE PREG, ED  ABO/RH   ____________________________________________  EKG  None ____________________________________________  RADIOLOGY  gestational sac consistent with 5 week 3 day urgency ____________________________________________   PROCEDURES  Procedure(s) performed: No    Critical Care performed:No ____________________________________________   INITIAL IMPRESSION / ASSESSMENT AND PLAN / ED COURSE  Pertinent labs & imaging results that were available during my care of the patient were reviewed by me and considered in my medical decision making (see chart for details).  patient presents with very mild bilateral abdominal cramping.she has a positive urgency test. HCG is greater than 4000, ultrasound demonstrates intrauterine gestational sac  Patient deferred pelvic exam, urinalysis consistent with likely UTI, we'll treat with Keflex, recommended close follow-up with gynecology    ____________________________________________   FINAL CLINICAL  IMPRESSION(S) / ED DIAGNOSES  Final diagnoses:  Lower urinary tract infectious disease      NEW MEDICATIONS STARTED DURING THIS VISIT:  Discharge Medication List as of 12/06/2016  1:53 PM    START taking these medications   Details  cephALEXin (KEFLEX) 500 MG capsule Take 1 capsule (500 mg total) by mouth 2 (two) times daily., Starting Mon 12/06/2016, Print         Note:  This document was prepared using Dragon voice recognition software and may include unintentional dictation errors.    Jene Every, MD 12/06/16 1426

## 2016-12-06 NOTE — ED Notes (Signed)
Attempt for bloodwork X 2 unsuccessful by this RN.

## 2016-12-06 NOTE — ED Triage Notes (Signed)
Patient presents to the ED with lower abdominal/pelvic cramping that began last night but is worse this morning.  Patient states she learned she was pregnant 2 days ago and thinks she is approx. [redacted] weeks pregnant.  Patient reports noting slight spotting but no heavy vaginal bleeding.  Patient appears slightly uncomfortable during triage.  Holding her abdomen.

## 2016-12-06 NOTE — ED Notes (Signed)
Pt given sandwich tray at this time  

## 2017-04-12 IMAGING — MR MR THORACIC SPINE WO/W CM
1 series · 7 of 7 positions shown · non-contrast
Comparison: none

[Series 100: counting loc · sagittal · 5.0mm · 0.68mm/px · 7 of 7 slices shown]
[im 1/7]
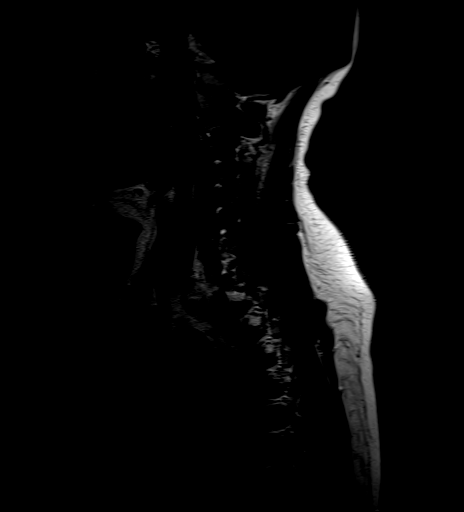
[im 2/7]
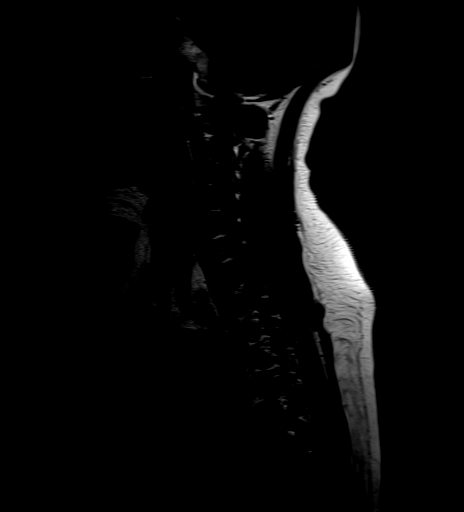
[im 3/7]
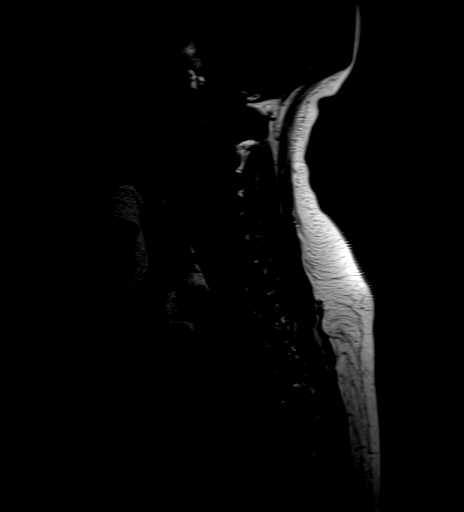
[im 4/7]
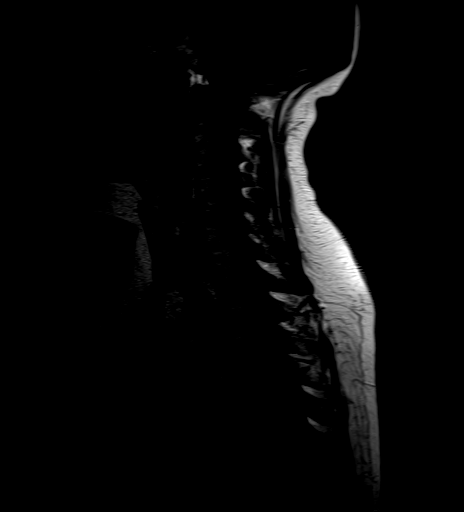
[im 5/7]
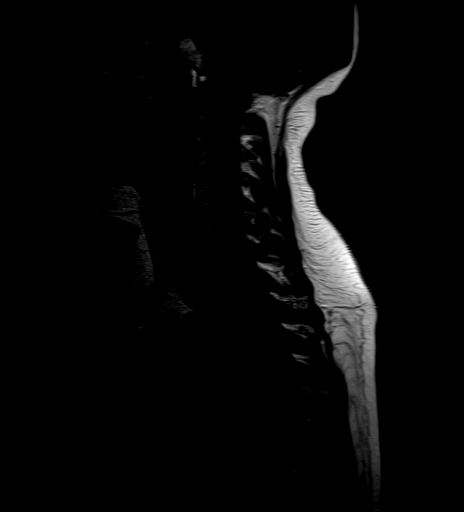
[im 6/7]
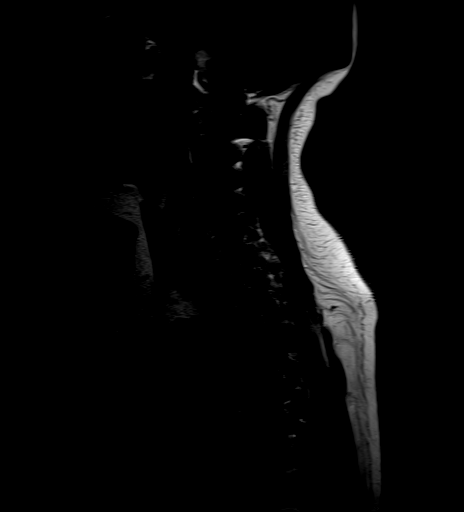
[im 7/7]
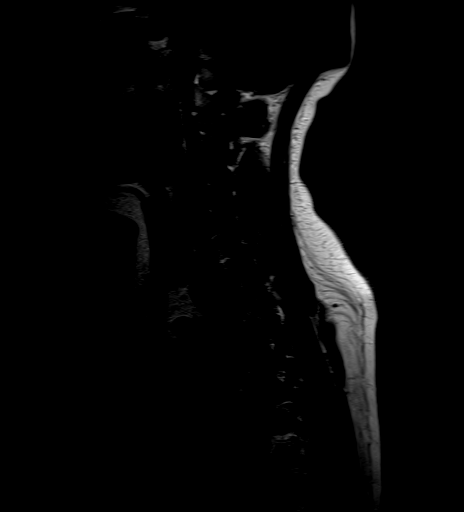

[7 of 7 positions shown; findings below may reference images not displayed]

:
Nondiagnostic examination. The patient was unable to tolerate
imaging. Only single localizer sequence was obtained. Examination is
nondiagnostic and no report is provided.

## 2017-08-17 ENCOUNTER — Emergency Department: Payer: Medicaid Other

## 2017-08-17 ENCOUNTER — Encounter: Payer: Self-pay | Admitting: Emergency Medicine

## 2017-08-17 ENCOUNTER — Other Ambulatory Visit: Payer: Self-pay

## 2017-08-17 ENCOUNTER — Emergency Department
Admission: EM | Admit: 2017-08-17 | Discharge: 2017-08-17 | Disposition: A | Discharge status: Home / Self Care | Payer: Medicaid Other | Attending: Emergency Medicine | Admitting: Emergency Medicine

## 2017-08-17 DIAGNOSIS — M545 Low back pain, unspecified: Secondary | ICD-10-CM

## 2017-08-17 DIAGNOSIS — R600 Localized edema: Secondary | ICD-10-CM | POA: Diagnosis not present

## 2017-08-17 DIAGNOSIS — N3001 Acute cystitis with hematuria: Secondary | ICD-10-CM | POA: Insufficient documentation

## 2017-08-17 DIAGNOSIS — R319 Hematuria, unspecified: Secondary | ICD-10-CM

## 2017-08-17 DIAGNOSIS — M79605 Pain in left leg: Secondary | ICD-10-CM

## 2017-08-17 DIAGNOSIS — M7989 Other specified soft tissue disorders: Secondary | ICD-10-CM

## 2017-08-17 DIAGNOSIS — R2243 Localized swelling, mass and lump, lower limb, bilateral: Secondary | ICD-10-CM | POA: Diagnosis present

## 2017-08-17 DIAGNOSIS — M79661 Pain in right lower leg: Secondary | ICD-10-CM

## 2017-08-17 DIAGNOSIS — F17210 Nicotine dependence, cigarettes, uncomplicated: Secondary | ICD-10-CM | POA: Diagnosis not present

## 2017-08-17 DIAGNOSIS — J45909 Unspecified asthma, uncomplicated: Secondary | ICD-10-CM | POA: Insufficient documentation

## 2017-08-17 DIAGNOSIS — N390 Urinary tract infection, site not specified: Secondary | ICD-10-CM

## 2017-08-17 DIAGNOSIS — R609 Edema, unspecified: Secondary | ICD-10-CM

## 2017-08-17 LAB — CBC
HCT: 38.4 % (ref 35.0–47.0)
Hemoglobin: 12.8 g/dL (ref 12.0–16.0)
MCH: 29.8 pg (ref 26.0–34.0)
MCHC: 33.3 g/dL (ref 32.0–36.0)
MCV: 89.5 fL (ref 80.0–100.0)
Platelets: 263 10*3/uL (ref 150–440)
RBC: 4.29 MIL/uL (ref 3.80–5.20)
RDW: 13.7 % (ref 11.5–14.5)
WBC: 6.2 10*3/uL (ref 3.6–11.0)

## 2017-08-17 LAB — URINALYSIS, COMPLETE (UACMP) WITH MICROSCOPIC
BILIRUBIN URINE: NEGATIVE
Glucose, UA: NEGATIVE mg/dL
Ketones, ur: NEGATIVE mg/dL
Nitrite: NEGATIVE
PROTEIN: NEGATIVE mg/dL
SPECIFIC GRAVITY, URINE: 1.012 (ref 1.005–1.030)
WBC, UA: >50 WBC/hpf — ABNORMAL HIGH (ref 0–5)
pH: 7 (ref 5.0–8.0)

## 2017-08-17 LAB — COMPREHENSIVE METABOLIC PANEL
ALBUMIN: 3.1 g/dL — AB (ref 3.5–5.0)
ALK PHOS: 58 U/L (ref 38–126)
ALT: 19 U/L (ref 0–44)
ANION GAP: 6 (ref 5–15)
AST: 21 U/L (ref 15–41)
BILIRUBIN TOTAL: 0.4 mg/dL (ref 0.3–1.2)
BUN: 13 mg/dL (ref 6–20)
CO2: 25 mmol/L (ref 22–32)
Calcium: 8.7 mg/dL — ABNORMAL LOW (ref 8.9–10.3)
Chloride: 109 mmol/L (ref 98–111)
Creatinine, Ser: 0.69 mg/dL (ref 0.44–1.00)
GFR calc non Af Amer: >60 mL/min (ref >60)
GLUCOSE: 81 mg/dL (ref 70–99)
POTASSIUM: 4.1 mmol/L (ref 3.5–5.1)
SODIUM: 140 mmol/L (ref 135–145)
TOTAL PROTEIN: 6.5 g/dL (ref 6.5–8.1)

## 2017-08-17 LAB — POCT PREGNANCY, URINE: PREG TEST UR: NEGATIVE

## 2017-08-17 MED ORDER — SULFAMETHOXAZOLE-TRIMETHOPRIM 800-160 MG PO TABS: 1.0000 | ORAL_TABLET | Freq: Two times a day (BID) | ORAL | 0 refills | Status: DC

## 2017-08-17 MED ORDER — TRAMADOL HCL 50 MG PO TABS: 50.0000 mg | ORAL_TABLET | Freq: Four times a day (QID) | ORAL | 0 refills | Status: DC | PRN

## 2017-08-17 MED ORDER — FUROSEMIDE 20 MG PO TABS: 10.0000 mg | ORAL_TABLET | Freq: Every day | ORAL | 0 refills | Status: DC

## 2017-08-17 MED ORDER — SULFAMETHOXAZOLE-TRIMETHOPRIM 800-160 MG PO TABS
1.0000 | ORAL_TABLET | Freq: Once | ORAL | Status: AC
  Administered 2017-08-17: 1 via ORAL
  Filled 2017-08-17: qty 1

## 2017-08-17 MED ORDER — OXYCODONE-ACETAMINOPHEN 5-325 MG PO TABS
2.0000 | ORAL_TABLET | Freq: Once | ORAL | Status: AC
  Administered 2017-08-17: 2 via ORAL
  Filled 2017-08-17: qty 2

## 2017-08-17 NOTE — ED Provider Notes (Addendum)
[facility] Emergency Department Provider Note       Time seen: ----------------------------------------- 1:02 PM on 08/17/2017 -----------------------------------------   I have reviewed the triage vital signs and the nursing notes.  HISTORY   Chief Complaint Leg Swelling and Back Pain    HPI Tammy Hickman is a 27 y.o. female with a history of anxiety, asthma, bipolar disorder, umbilical hernia who presents to the ED for swelling to her legs, hands with back pain.  Patient states she had gave birth on June 7 and had an epidural.  She has history of back surgery 2 years ago.  She is having pain in the lower back where they did the surgery.  She had a vaginal delivery that was normal 2 weeks and 5 days ago and the back pain is worse since her epidural.  Past Medical History:  Diagnosis Date  . Anxiety   . Asthma   . Bipolar disorder (HCC)   . Hernia, umbilical 2011  . Mental disorders of mother, antepartum   . Obesity complicating pregnancy, childbirth, or the puerperium, antepartum condition or complication(649.13)   . Other specified indication for care or intervention related to labor and delivery, antepartum   . Thyroid dysfunction     Patient Active Problem List   Diagnosis Date Noted  . Pyelonephritis 12/08/2015  . Cocaine abuse (HCC)   . Elevated troponin I level   . Leg swelling   . NSTEMI (non-ST elevated myocardial infarction) (HCC) 11/03/2015  . Low back pain 01/13/2015  . Leg pain, right 01/13/2015  . Opiate overdose (HCC) 10/31/2014  . Dysthymia 10/31/2014  . ADHD (attention deficit hyperactivity disorder) 10/31/2014  . Obesity 07/27/2011  . Contraception, generic surveillance 07/27/2011    Past Surgical History:  Procedure Laterality Date  . ABDOMINAL SURGERY    . BACK SURGERY    . HERNIA REPAIR     umbilical hernia  . TONSILLECTOMY  2009    Allergies Fentanyl; Meloxicam; Penicillins; Prednisone; and  Hydromorphone  Social History Social History   Tobacco Use  . Smoking status: Light Tobacco Smoker    Packs/day: 0.20    Last attempt to quit: 11/14/2009    Years since quitting: 7.7  . Smokeless tobacco: Never Used  Substance Use Topics  . Alcohol use: No    Alcohol/week: 0.0 oz  . Drug use: Yes    Types: Cocaine, Marijuana    Comment: Pain pills   Review of Systems Constitutional: Negative for fever. Cardiovascular: Negative for chest pain. Respiratory: Negative for shortness of breath. Gastrointestinal: Negative for abdominal pain, vomiting and diarrhea. Genitourinary: Negative for dysuria. Musculoskeletal: Positive for back pain Skin: Negative for rash. Neurological: Negative for headaches, focal weakness or numbness.  All systems negative/normal/unremarkable except as stated in the HPI  ____________________________________________   PHYSICAL EXAM:  VITAL SIGNS: ED Triage Vitals  Enc Vitals Group     BP 08/17/17 1102 121/80     Pulse Rate 08/17/17 1102 80     Resp 08/17/17 1102 20     Temp 08/17/17 1102 97.9 F (36.6 C)     Temp Source 08/17/17 1102 Oral     SpO2 08/17/17 1102 100 %     Weight 08/17/17 1104 275 lb (124.7 kg)     Height 08/17/17 1104 5\' 8"  (1.727 m)     Head Circumference --      Peak Flow --      Pain Score 08/17/17 1104 8     Pain Loc --  Pain Edu? --      Excl. in GC? --    Constitutional: Alert and oriented. Well appearing and in no distress. Eyes: Conjunctivae are normal. Normal extraocular movements. Cardiovascular: Normal rate, regular rhythm. No murmurs, rubs, or gallops. Respiratory: Normal respiratory effort without tachypnea nor retractions. Breath sounds are clear and equal bilaterally. No wheezes/rales/rhonchi. Gastrointestinal: Soft and nontender. Normal bowel sounds Musculoskeletal: Nontender with normal range of motion in extremities.  Bilateral edema is noted Neurologic:  Normal speech and language. No gross focal  neurologic deficits are appreciated.  Skin:  Skin is warm, dry and intact. No rash noted. Psychiatric: Mood and affect are normal. Speech and behavior are normal.  ____________________________________________  ED COURSE:  As part of my medical decision making, I reviewed the following data within the electronic MEDICAL RECORD NUMBER History obtained from family if available, nursing notes, old chart and ekg, as well as notes from prior ED visits. Patient presented for back pain and edema, we will assess with labs and imaging as indicated at this time.   Procedures ____________________________________________   LABS (pertinent positives/negatives)  Labs Reviewed  COMPREHENSIVE METABOLIC PANEL - Abnormal; Notable for the following components:      Result Value   Calcium 8.7 (*)    Albumin 3.1 (*)    All other components within normal limits  URINALYSIS, COMPLETE (UACMP) WITH MICROSCOPIC - Abnormal; Notable for the following components:   Color, Urine YELLOW (*)    APPearance CLOUDY (*)    Hgb urine dipstick LARGE (*)    Leukocytes, UA LARGE (*)    WBC, UA >50 (*)    Bacteria, UA RARE (*)    All other components within normal limits  CBC  POCT PREGNANCY, URINE  POC URINE PREG, ED   EKG: Interpreted by me, normal sinus rhythm rate of 76 bpm, normal PR interval, normal QRS, normal QT  RADIOLOGY Images were viewed by me  LS spine films are negative Ultrasound is negative for DVT Chest x-ray is negative ____________________________________________  DIFFERENTIAL DIAGNOSIS   UTI, pyelonephritis, preeclampsia, peripheral edema, DVT dehydration, electrolyte abnormality  FINAL ASSESSMENT AND PLAN  Edema, UTI, back pain   Plan: The patient had presented for multiple complaints including back pain and peripheral edema. Patient's labs did reveal a urinary tract infection but were otherwise negative, no findings suggestive of preeclampsia. Patient's imaging was also negative.  She  will be discharged with a low-dose diuretic as well as antibiotics for her urine.  She is cleared for outpatient follow-up.   Ulice Dash, MD   Note: This note was generated in part or whole with voice recognition software. Voice recognition is usually quite accurate but there are transcription errors that can and very often do occur. I apologize for any typographical errors that were not detected and corrected.     Emily Filbert, MD 08/17/17 8119    Emily Filbert, MD 08/26/17 [phone]

## 2017-08-17 NOTE — ED Notes (Signed)
Patient states her back pain is worse since epidural 3 wks. Ago.  [facility] and 5 days [facility].  Denies SHOB.

## 2017-08-17 NOTE — ED Notes (Addendum)
Pt able to stand from wheel chair and transfer to the bed in room. Pt states since swelling she occasionally has numbness and tingling in the hands

## 2017-08-17 NOTE — ED Triage Notes (Signed)
Patient to ED complaining of swelling in lower legs, hands, and back pain.  States she gave birth on June 7 and had an epidural.  Hx of back surgery 2 years ago.  Complaining of pain lower back "where they did the surgery".  Uns[facility].

## 2017-08-17 NOTE — ED Notes (Signed)
First Nurse Note:  Patient reviewed with Dr. Roxan Hockeyobinson, orders placed.  EKG performed.

## 2017-08-17 NOTE — ED Notes (Signed)
First Nurse Note:  Patient has bilateral swelling lower extremeties.

## 2017-08-17 NOTE — ED Notes (Signed)
Pt attempted to provide urine for POC pregnancy test for DG scans. Pt missed hat in toilet, unable to collect urine at this time. Pt given fluids to encourage urination

## 2017-08-20 LAB — URINE CULTURE: Special Requests: NORMAL

## 2017-12-26 ENCOUNTER — Emergency Department: Payer: Medicaid Other

## 2017-12-26 ENCOUNTER — Emergency Department
Admission: EM | Admit: 2017-12-26 | Discharge: 2017-12-26 | Disposition: A | Discharge status: Home / Self Care | Payer: Medicaid Other | Attending: Emergency Medicine | Admitting: Emergency Medicine

## 2017-12-26 ENCOUNTER — Other Ambulatory Visit: Payer: Self-pay

## 2017-12-26 DIAGNOSIS — J209 Acute bronchitis, unspecified: Secondary | ICD-10-CM | POA: Diagnosis not present

## 2017-12-26 DIAGNOSIS — Z720 Tobacco use: Secondary | ICD-10-CM | POA: Diagnosis not present

## 2017-12-26 DIAGNOSIS — Z79899 Other long term (current) drug therapy: Secondary | ICD-10-CM | POA: Diagnosis not present

## 2017-12-26 DIAGNOSIS — J45909 Unspecified asthma, uncomplicated: Secondary | ICD-10-CM | POA: Insufficient documentation

## 2017-12-26 DIAGNOSIS — I252 Old myocardial infarction: Secondary | ICD-10-CM | POA: Insufficient documentation

## 2017-12-26 DIAGNOSIS — R05 Cough: Secondary | ICD-10-CM | POA: Diagnosis present

## 2017-12-26 MED ORDER — SULFAMETHOXAZOLE-TRIMETHOPRIM 800-160 MG PO TABS: 1.0000 | ORAL_TABLET | Freq: Two times a day (BID) | ORAL | 0 refills | Status: DC

## 2017-12-26 MED ORDER — PSEUDOEPH-BROMPHEN-DM 30-2-10 MG/5ML PO SYRP: 5.0000 mL | ORAL_SOLUTION | Freq: Four times a day (QID) | ORAL | 0 refills | Status: DC | PRN

## 2017-12-26 MED ORDER — ALBUTEROL SULFATE (2.5 MG/3ML) 0.083% IN NEBU
2.5000 mg | INHALATION_SOLUTION | Freq: Once | RESPIRATORY_TRACT | Status: AC
  Administered 2017-12-26: 2.5 mg via RESPIRATORY_TRACT
  Filled 2017-12-26: qty 3

## 2017-12-26 MED ORDER — ALBUTEROL SULFATE HFA 108 (90 BASE) MCG/ACT IN AERS: 2.0000 | INHALATION_SPRAY | Freq: Four times a day (QID) | RESPIRATORY_TRACT | 2 refills | Status: DC | PRN

## 2017-12-26 MED ORDER — ALBUTEROL SULFATE (2.5 MG/3ML) 0.083% IN NEBU
5.0000 mg | INHALATION_SOLUTION | Freq: Once | RESPIRATORY_TRACT | Status: AC
  Administered 2017-12-26: 5 mg via RESPIRATORY_TRACT
  Filled 2017-12-26: qty 6

## 2017-12-26 NOTE — ED Provider Notes (Signed)
[facility] Emergency Department Provider Note  ____________________________________________   First MD Initiated Contact with Patient 12/26/17 1237     (approximate)  I have reviewed the triage vital signs and the nursing notes.   HISTORY  Chief Complaint Shortness of Breath and Cough  HPI [place] Tammy Hickman is Tammy 27 y.o. female presents to the ED with complaint of cough, congestion and shortness of breath for 1 week.  Patient states that she has smoked in the past but is not Tammy current smoker.  She denies any fever.  Patient continues to talk but with Tammy hoarse voice.  Patient has an infant with her but states that she does not breast-feed.   Past Medical History:  Diagnosis Date  . Anxiety   . Asthma   . Bipolar disorder (HCC)   . Hernia, umbilical 2011  . Mental disorders of mother, antepartum   . Obesity complicating pregnancy, childbirth, or the puerperium, antepartum condition or complication(649.13)   . Other specified indication for care or intervention related to labor and delivery, antepartum   . Thyroid dysfunction     Patient Active Problem List   Diagnosis Date Noted  . Pyelonephritis 12/08/2015  . Cocaine abuse (HCC)   . Elevated troponin I level   . Leg swelling   . NSTEMI (non-ST elevated myocardial infarction) (HCC) 11/03/2015  . Low back pain 01/13/2015  . Leg pain, right 01/13/2015  . Opiate overdose (HCC) 10/31/2014  . Dysthymia 10/31/2014  . ADHD (attention deficit hyperactivity disorder) 10/31/2014  . Obesity 07/27/2011  . Contraception, generic surveillance 07/27/2011    Past Surgical History:  Procedure Laterality Date  . ABDOMINAL SURGERY    . BACK SURGERY    . HERNIA REPAIR     umbilical hernia  . TONSILLECTOMY  2009    Prior to Admission medications   Medication Sig Start Date End Date Taking? Authorizing Provider  acetaminophen (TYLENOL) 325 MG tablet Take 2 tablets (650 mg total) by mouth every 6 (six)  hours as needed for moderate pain. 10/09/15   Hagler, Jami L, PA-C  albuterol (PROVENTIL HFA;VENTOLIN HFA) 108 (90 Base) MCG/ACT inhaler Inhale 2 puffs into the lungs every 6 (six) hours as needed for wheezing or shortness of breath. 12/26/17   Tommi Rumps, PA-C  brompheniramine-pseudoephedrine-DM 30-2-10 MG/5ML syrup Take 5 mLs by mouth 4 (four) times daily as needed. 12/26/17   Tommi Rumps, PA-C  furosemide (LASIX) 20 MG tablet Take 0.5 tablets (10 mg total) by mouth daily for 7 days. 08/17/17 08/24/17  Emily Filbert, MD  sulfamethoxazole-trimethoprim (BACTRIM DS,SEPTRA DS) 800-160 MG tablet Take 1 tablet by mouth 2 (two) times daily. 12/26/17   Tommi Rumps, PA-C    Allergies Fentanyl; Meloxicam; Penicillins; Prednisone; and Hydromorphone  Family History  Problem Relation Age of Onset  . Cancer Paternal Grandfather        Esophageal  . Heart disease Paternal Grandmother   . Diabetes Maternal Grandmother   . Heart disease Maternal Grandfather   . Diabetes Maternal Grandfather   . Cancer Maternal Grandfather        Esophageal  . Heart disease Maternal Aunt        Fatal MI in late-30's    Social History Social History   Tobacco Use  . Smoking status: Light Tobacco Smoker    Packs/day: 0.20    Last attempt to quit: 11/14/2009    Years since quitting: 8.1  . Smokeless tobacco: Never Used  Substance  Use Topics  . Alcohol use: No    Alcohol/week: 0.0 standard drinks  . Drug use: Yes    Types: Cocaine, Marijuana    Comment: Pain pills    Review of Systems Constitutional: No fever/chills Eyes: No visual changes. ENT: No sore throat.  Positive for nasal congestion. Cardiovascular: Denies chest pain. Respiratory: Denies shortness of breath.  Positive for shortness of breath and productive cough. Gastrointestinal: No abdominal pain.  No nausea, no vomiting.  No diarrhea.  No constipation. Genitourinary: Negative for dysuria. Musculoskeletal: Negative for back  pain. Skin: Negative for rash. Neurological: Negative for headaches, focal weakness or numbness. ___________________________________________   PHYSICAL EXAM:  VITAL SIGNS: ED Triage Vitals  Enc Vitals Group     BP 12/26/17 1142 126/77     Pulse Rate 12/26/17 1142 (!) 101     Resp 12/26/17 1142 (!) 22     Temp 12/26/17 1142 98.3 F (36.8 C)     Temp Source 12/26/17 1142 Oral     SpO2 12/26/17 1142 98 %     Weight 12/26/17 1143 250 lb (113.4 kg)     Height 12/26/17 1143 5\' 8"  (1.727 m)     Head Circumference --      Peak Flow --      Pain Score --      Pain Loc --      Pain Edu? --      Excl. in GC? --    Constitutional: Alert and oriented. Well appearing and in no acute distress. Eyes: Conjunctivae are normal. PERRL. EOMI. Head: Atraumatic. Nose: Mild congestion/rhinnorhea. Mouth/Throat: Mucous membranes are moist.  Oropharynx non-erythematous.  Positive posterior drainage. Neck: No stridor.   Hematological/Lymphatic/Immunilogical: No cervical lymphadenopathy. Cardiovascular: Normal rate, regular rhythm. Grossly normal heart sounds.  Good peripheral circulation. Respiratory: Normal respiratory effort.  No retractions. Lungs mild expiratory wheeze noted with Tammy bronchitic cough. Gastrointestinal: Soft and nontender. No distention. Musculoskeletal: Moves upper and lower extremities then difficulty and normal gait was noted. Neurologic:  Normal speech and language. No gross focal neurologic deficits are appreciated. No gait instability. Skin:  Skin is warm, dry and intact. No rash noted. Psychiatric: Mood and affect are normal. Speech and behavior are normal.  ____________________________________________   LABS (all labs ordered are listed, but only abnormal results are displayed)  Labs Reviewed - No data to display  RADIOLOGY  ED MD interpretation:   Chest x-ray is negative for pneumonia.  Official radiology report(s): Dg Chest 2 View  Result Date:  12/26/2017 CLINICAL DATA:  Shortness of breath and mildly productive cough for the past week. History of asthma. EXAM: CHEST - 2 VIEW COMPARISON:  PA chest x-ray of August 17, 2017 FINDINGS: The lungs are adequately inflated. There is no focal infiltrate. There is no pleural effusion. The heart and pulmonary vascularity are normal. The mediastinum is normal in width. The bony thorax exhibits no acute abnormality. IMPRESSION: There is no active cardiopulmonary disease. Electronically Signed   By: David  [place] M.D.   On: 12/26/2017 13:15   ____________________________________________   PROCEDURES  Procedure(s) performed: None  Procedures  Critical Care performed: No  ____________________________________________   INITIAL IMPRESSION / ASSESSMENT AND PLAN / ED COURSE  As part of my medical decision making, I reviewed the following data within the electronic MEDICAL RECORD NUMBER Notes from prior ED visits and [place] Controlled Substance Database  Patient presents to the ED with complaint of shortness of breath and productive cough for 1 week.  Patient was  Tammy former smoker.  She was given Tammy nebulizer treatment while in triage that apparently has helped with some of her cough.  Chest x-ray was reassuring and patient currently on exam was not wheezing.  She has Tammy very bronchitic cough but is able to talk in complete sentences and also walk in the hallway without any distress.  Patient is currently not breast-feeding.  She was given Tammy prescription for albuterol inhaler and Bromfed-DM.  She also was given prescription for Bactrim DS twice daily for 10 days as she clinically sounds like she has bronchitis.  She is to follow-up with her PCP or [facility] if any continued problems.  ____________________________________________   FINAL CLINICAL IMPRESSION(S) / ED DIAGNOSES  Final diagnoses:  Acute bronchitis, unspecified organism     ED Discharge Orders         Ordered    albuterol (PROVENTIL  HFA;VENTOLIN HFA) 108 (90 Base) MCG/ACT inhaler  Every 6 hours PRN     12/26/17 1418    brompheniramine-pseudoephedrine-DM 30-2-10 MG/5ML syrup  4 times daily PRN     12/26/17 1418    sulfamethoxazole-trimethoprim (BACTRIM DS,SEPTRA DS) 800-160 MG tablet  2 times daily     12/26/17 1418           Note:  This document was prepared using Dragon voice recognition software and may include unintentional dictation errors.    Tommi Rumps, PA-C 12/26/17 1546    Minna Antis, MD 12/27/17 2355

## 2017-12-26 NOTE — Discharge Instructions (Signed)
Follow-up with your primary care or [facility] acute care if any continued problems.  Begin taking medication as directed.  Albuterol 2 puffs every 6 hours as needed for wheezing or shortness of breath.  Bromfed-DM as needed for cough and congestion.  Bactrim DS twice daily for the next 10 days.  Return to the emergency department if any severe worsening of your symptoms.  Increase fluids.  Tylenol if needed for fever.

## 2017-12-26 NOTE — ED Triage Notes (Signed)
C/o SOB and cough X 1 week. Coughing up small productive sputum per patient. Hoarse voice.

## 2018-07-05 ENCOUNTER — Emergency Department: Payer: Medicaid Other

## 2018-07-05 ENCOUNTER — Other Ambulatory Visit: Payer: Self-pay

## 2018-07-05 ENCOUNTER — Encounter: Payer: Self-pay | Admitting: Emergency Medicine

## 2018-07-05 ENCOUNTER — Emergency Department
Admission: EM | Admit: 2018-07-05 | Discharge: 2018-07-06 | Disposition: A | Discharge status: Home / Self Care | Payer: Medicaid Other | Attending: Emergency Medicine | Admitting: Emergency Medicine

## 2018-07-05 DIAGNOSIS — J45909 Unspecified asthma, uncomplicated: Secondary | ICD-10-CM | POA: Insufficient documentation

## 2018-07-05 DIAGNOSIS — R1011 Right upper quadrant pain: Secondary | ICD-10-CM | POA: Diagnosis present

## 2018-07-05 DIAGNOSIS — I252 Old myocardial infarction: Secondary | ICD-10-CM | POA: Diagnosis not present

## 2018-07-05 DIAGNOSIS — F17210 Nicotine dependence, cigarettes, uncomplicated: Secondary | ICD-10-CM | POA: Insufficient documentation

## 2018-07-05 DIAGNOSIS — F1410 Cocaine abuse, uncomplicated: Secondary | ICD-10-CM | POA: Insufficient documentation

## 2018-07-05 DIAGNOSIS — R112 Nausea with vomiting, unspecified: Secondary | ICD-10-CM | POA: Diagnosis not present

## 2018-07-05 DIAGNOSIS — F1210 Cannabis abuse, uncomplicated: Secondary | ICD-10-CM | POA: Diagnosis not present

## 2018-07-05 DIAGNOSIS — N12 Tubulo-interstitial nephritis, not specified as acute or chronic: Secondary | ICD-10-CM | POA: Insufficient documentation

## 2018-07-05 LAB — CBC WITH DIFFERENTIAL/PLATELET
Abs Immature Granulocytes: 0.07 10*3/uL (ref 0.00–0.07)
Basophils Absolute: 0 10*3/uL (ref 0.0–0.1)
Basophils Relative: 0 %
Eosinophils Absolute: 0.2 10*3/uL (ref 0.0–0.5)
Eosinophils Relative: 2 %
HCT: 42.1 % (ref 36.0–46.0)
Hemoglobin: 13.2 g/dL (ref 12.0–15.0)
Immature Granulocytes: 1 %
Lymphocytes Relative: 22 %
Lymphs Abs: 2.9 10*3/uL (ref 0.7–4.0)
MCH: 27.8 pg (ref 26.0–34.0)
MCHC: 31.4 g/dL (ref 30.0–36.0)
MCV: 88.8 fL (ref 80.0–100.0)
Monocytes Absolute: 0.7 10*3/uL (ref 0.1–1.0)
Monocytes Relative: 5 %
Neutro Abs: 9.5 10*3/uL — ABNORMAL HIGH (ref 1.7–7.7)
Neutrophils Relative %: 70 %
Platelets: 328 10*3/uL (ref 150–400)
RBC: 4.74 MIL/uL (ref 3.87–5.11)
RDW: 14.3 % (ref 11.5–15.5)
WBC: 13.4 10*3/uL — ABNORMAL HIGH (ref 4.0–10.5)
nRBC: 0 % (ref 0.0–0.2)

## 2018-07-05 LAB — URINALYSIS, COMPLETE (UACMP) WITH MICROSCOPIC
Bacteria, UA: NONE SEEN
Bilirubin Urine: NEGATIVE
Glucose, UA: NEGATIVE mg/dL
Hgb urine dipstick: NEGATIVE
Ketones, ur: NEGATIVE mg/dL
Nitrite: NEGATIVE
Protein, ur: 30 mg/dL — AB
Specific Gravity, Urine: 1.03 (ref 1.005–1.030)
Squamous Epithelial / LPF: >50 — ABNORMAL HIGH (ref 0–5)
WBC, UA: >50 WBC/hpf — ABNORMAL HIGH (ref 0–5)
pH: 6 (ref 5.0–8.0)

## 2018-07-05 LAB — LIPASE, BLOOD: Lipase: 38 U/L (ref 11–51)

## 2018-07-05 LAB — COMPREHENSIVE METABOLIC PANEL
ALT: 15 U/L (ref 0–44)
AST: 11 U/L — ABNORMAL LOW (ref 15–41)
Albumin: 3.7 g/dL (ref 3.5–5.0)
Alkaline Phosphatase: 70 U/L (ref 38–126)
Anion gap: 7 (ref 5–15)
BUN: 20 mg/dL (ref 6–20)
CO2: 25 mmol/L (ref 22–32)
Calcium: 8.9 mg/dL (ref 8.9–10.3)
Chloride: 108 mmol/L (ref 98–111)
Creatinine, Ser: 0.87 mg/dL (ref 0.44–1.00)
GFR calc Af Amer: >60 mL/min (ref >60)
GFR calc non Af Amer: >60 mL/min (ref >60)
Glucose, Bld: 94 mg/dL (ref 70–99)
Potassium: 4 mmol/L (ref 3.5–5.1)
Sodium: 140 mmol/L (ref 135–145)
Total Bilirubin: 0.3 mg/dL (ref 0.3–1.2)
Total Protein: 7.3 g/dL (ref 6.5–8.1)

## 2018-07-05 LAB — HCG, QUANTITATIVE, PREGNANCY: hCG, Beta Chain, Quant, S: <1 m[IU]/mL (ref <5)

## 2018-07-05 LAB — POCT PREGNANCY, URINE: Preg Test, Ur: NEGATIVE

## 2018-07-05 MED ORDER — ONDANSETRON HCL 4 MG/2ML IJ SOLN
4.0000 mg | Freq: Once | INTRAMUSCULAR | Status: AC
  Administered 2018-07-05: 4 mg via INTRAVENOUS
  Filled 2018-07-05: qty 2

## 2018-07-05 MED ORDER — SODIUM CHLORIDE 0.9 % IV BOLUS
1000.0000 mL | Freq: Once | INTRAVENOUS | Status: AC
Start: 2018-07-05 — End: 2018-07-06
  Administered 2018-07-05: 1000 mL via INTRAVENOUS

## 2018-07-05 MED ORDER — KETOROLAC TROMETHAMINE 30 MG/ML IJ SOLN
15.0000 mg | Freq: Once | INTRAMUSCULAR | Status: AC
Start: 2018-07-05 — End: 2018-07-05
  Administered 2018-07-05: 22:00:00 15 mg via INTRAVENOUS
  Filled 2018-07-05: qty 1

## 2018-07-05 MED ORDER — SODIUM CHLORIDE 0.9 % IV SOLN
1.0000 g | Freq: Once | INTRAVENOUS | Status: AC
  Administered 2018-07-05: 1 g via INTRAVENOUS
  Filled 2018-07-05: qty 10

## 2018-07-05 NOTE — ED Notes (Signed)
PT is in [place] 

## 2018-07-05 NOTE — ED Provider Notes (Signed)
[facility] Emergency Department Provider Note  ____________________________________________  Time seen: Approximately 11:04 PM  I have reviewed the triage vital signs and the nursing notes.   HISTORY  Chief Complaint Abdominal Pain   HPI Tammy Hickman is a 28 y.o. female with a history of bipolar disorder, obesity, hypothyroidism, pyelonephritis, and cholecystectomy who presents for evaluation of abdominal pain.  Patient reports 1 week of several daily episodes of nausea and vomiting.  Unable to keep anything down.  Today started having right upper quadrant abdominal pain that she describes as sharp, severe, constant, radiating to the right flank.  No fever or chills, no cough, chest pain or shortness of breath.  No dysuria or hematuria, no vaginal discharge or bleeding.   Past Medical History:  Diagnosis Date  . Anxiety   . Asthma   . Bipolar disorder (HCC)   . Hernia, umbilical 2011  . Mental disorders of mother, antepartum   . Obesity complicating pregnancy, childbirth, or the puerperium, antepartum condition or complication(649.13)   . Other specified indication for care or intervention related to labor and delivery, antepartum   . Thyroid dysfunction     Patient Active Problem List   Diagnosis Date Noted  . Pyelonephritis 12/08/2015  . Cocaine abuse (HCC)   . Elevated troponin I level   . Leg swelling   . NSTEMI (non-ST elevated myocardial infarction) (HCC) 11/03/2015  . Low back pain 01/13/2015  . Leg pain, right 01/13/2015  . Opiate overdose (HCC) 10/31/2014  . Dysthymia 10/31/2014  . ADHD (attention deficit hyperactivity disorder) 10/31/2014  . Obesity 07/27/2011  . Contraception, generic surveillance 07/27/2011    Past Surgical History:  Procedure Laterality Date  . ABDOMINAL SURGERY    . BACK SURGERY    . HERNIA REPAIR     umbilical hernia  . TONSILLECTOMY  2009    Prior to Admission medications   Medication Sig  Start Date End Date Taking? Authorizing Provider  acetaminophen (TYLENOL) 325 MG tablet Take 2 tablets (650 mg total) by mouth every 6 (six) hours as needed for moderate pain. 10/09/15   Hagler, Jami L, PA-C  albuterol (PROVENTIL HFA;VENTOLIN HFA) 108 (90 Base) MCG/ACT inhaler Inhale 2 puffs into the lungs every 6 (six) hours as needed for wheezing or shortness of breath. 12/26/17   Tommi Rumps, PA-C  brompheniramine-pseudoephedrine-DM 30-2-10 MG/5ML syrup Take 5 mLs by mouth 4 (four) times daily as needed. 12/26/17   Tommi Rumps, PA-C  furosemide (LASIX) 20 MG tablet Take 0.5 tablets (10 mg total) by mouth daily for 7 days. 08/17/17 08/24/17  Emily Filbert, MD  sulfamethoxazole-trimethoprim (BACTRIM DS,SEPTRA DS) 800-160 MG tablet Take 1 tablet by mouth 2 (two) times daily. 12/26/17   Tommi Rumps, PA-C    Allergies Fentanyl; Meloxicam; Penicillins; Prednisone; and Hydromorphone  Family History  Problem Relation Age of Onset  . Cancer Paternal Grandfather        Esophageal  . Heart disease Paternal Grandmother   . Diabetes Maternal Grandmother   . Heart disease Maternal Grandfather   . Diabetes Maternal Grandfather   . Cancer Maternal Grandfather        Esophageal  . Heart disease Maternal Aunt        Fatal MI in late-30's    Social History Social History   Tobacco Use  . Smoking status: Light Tobacco Smoker    Packs/day: 0.20    Last attempt to quit: 11/14/2009    Years since quitting:  8.6  . Smokeless tobacco: Never Used  Substance Use Topics  . Alcohol use: No    Alcohol/week: 0.0 standard drinks  . Drug use: Yes    Types: Cocaine, Marijuana    Comment: Pain pills    Review of Systems  Constitutional: Negative for fever. + chills Eyes: Negative for visual changes. ENT: Negative for sore throat. Neck: No neck pain  Cardiovascular: Negative for chest pain. Respiratory: Negative for shortness of breath. Gastrointestinal: + RUQ abdominal pain, nausea,  and vomiting. No diarrhea. Genitourinary: Negative for dysuria. Musculoskeletal: Negative for back pain. Skin: Negative for rash. Neurological: Negative for headaches, weakness or numbness. Psych: No SI or HI  ____________________________________________   PHYSICAL EXAM:  VITAL SIGNS: ED Triage Vitals  Enc Vitals Group     BP 07/05/18 2130 125/81     Pulse Rate 07/05/18 2130 93     Resp 07/05/18 2130 18     Temp 07/05/18 2130 98.3 F (36.8 C)     Temp Source 07/05/18 2130 Oral     SpO2 07/05/18 2130 97 %     Weight 07/05/18 2130 245 lb (111.1 kg)     Height 07/05/18 2130  (1.753 m)     Head Circumference --      Peak Flow --      Pain Score 07/05/18 2133 7     Pain Loc --      Pain Edu? --      Excl. in GC? --     Constitutional: Alert and oriented. Well appearing and in no apparent distress. HEENT:      Head: Normocephalic and atraumatic.         Eyes: Conjunctivae are normal. Sclera is non-icteric.       Mouth/Throat: Mucous membranes are moist.       Neck: Supple with no signs of meningismus. Cardiovascular: Regular rate and rhythm. No murmurs, gallops, or rubs. 2+ symmetrical distal pulses are present in all extremities. No JVD. Respiratory: Normal respiratory effort. Lungs are clear to auscultation bilaterally. No wheezes, crackles, or rhonchi.  Gastrointestinal: Obese, soft, right upper quadrant tenderness, epigastric tenderness, negative Murphy sign, and non distended with positive bowel sounds. No rebound or guarding. Genitourinary: No CVA tenderness. Musculoskeletal: Nontender with normal range of motion in all extremities. No edema, cyanosis, or erythema of extremities. Neurologic: Normal speech and language. Face is symmetric. Moving all extremities. No gross focal neurologic deficits are appreciated. Skin: Skin is warm, dry and intact. No rash noted. Psychiatric: Mood and affect are normal. Speech and behavior are normal.   ____________________________________________   LABS (all labs ordered are listed, but only abnormal results are displayed)  Labs Reviewed  CBC WITH DIFFERENTIAL/PLATELET - Abnormal; Notable for the following components:      Result Value   WBC 13.4 (*)    Neutro Abs 9.5 (*)    All other components within normal limits  COMPREHENSIVE METABOLIC PANEL - Abnormal; Notable for the following components:   AST 11 (*)    All other components within normal limits  URINALYSIS, COMPLETE (UACMP) WITH MICROSCOPIC - Abnormal; Notable for the following components:   Color, Urine YELLOW (*)    APPearance CLOUDY (*)    Protein, ur 30 (*)    Leukocytes,Ua MODERATE (*)    WBC, UA >50 (*)    Squamous Epithelial / LPF >50 (*)    All other components within normal limits  LIPASE, BLOOD  HCG, QUANTITATIVE, PREGNANCY  POCT PREGNANCY, URINE  ____________________________________________  EKG  none  ____________________________________________  RADIOLOGY  Imaging pending   ____________________________________________   PROCEDURES  Procedure(s) performed: None Procedures Critical Care performed:  None ____________________________________________   INITIAL IMPRESSION / ASSESSMENT AND PLAN / ED COURSE  28 y.o. female with a history of bipolar disorder, obesity, hypothyroidism, pyelonephritis, and cholecystectomy who presents for evaluation of abdominal pain.  Patient looks uncomfortable due to pain with normal vital signs, abdomen is obese with right upper quadrant epigastric tenderness, negative Murphy sign.  Differential diagnosis including cholelithiasis versus cholecystitis versus pyelonephritis versus kidney stone.  Pregnancy test negative.  CBC showing leukocytosis with white count of 13.4, normal liver enzymes, normal lipase, normal electrolytes.  UA positive for UTI. Pending RUQ US. If negative plan for CT renal to rule out obstructing stone in the setting of UTI. Care transferred to  dr. Manson PasseyBrown.       As part of my medical decision making, I reviewed the following data within the electronic MEDICAL RECORD NUMBER Nursing notes reviewed and incorporated, Labs reviewed , Old chart reviewed, Radiograph reviewed , Notes from prior ED visits and [place] Controlled Substance Database    Pertinent labs & imaging results that were available during my care of the patient were reviewed by me and considered in my medical decision making (see chart for details).    ____________________________________________   FINAL CLINICAL IMPRESSION(S) / ED DIAGNOSES  Final diagnoses:  RUQ abdominal pain      NEW MEDICATIONS STARTED DURING THIS VISIT:  ED Discharge Orders    None       Note:  This document was prepared using Dragon voice recognition software and may include unintentional dictation errors.    Don PerkingVeronese, [place], MD 07/05/18 [phone]

## 2018-07-05 NOTE — ED Triage Notes (Signed)
Pt to triage via w/c with no distress noted; pt reports several days having N/V and right sided abd pain

## 2018-07-05 NOTE — ED Notes (Signed)
PT given phone to call husband 

## 2018-07-06 ENCOUNTER — Emergency Department: Payer: Medicaid Other

## 2018-07-06 MED ORDER — MORPHINE SULFATE (PF) 2 MG/ML IV SOLN
2.0000 mg | Freq: Once | INTRAVENOUS | Status: AC
  Administered 2018-07-06: 2 mg via INTRAVENOUS
  Filled 2018-07-06: qty 1

## 2018-07-06 MED ORDER — SULFAMETHOXAZOLE-TRIMETHOPRIM 800-160 MG PO TABS: 1.0000 | ORAL_TABLET | Freq: Two times a day (BID) | ORAL | 0 refills | Status: AC

## 2018-07-06 MED ORDER — OXYCODONE-ACETAMINOPHEN 5-325 MG PO TABS
1.0000 | ORAL_TABLET | Freq: Once | ORAL | Status: AC
  Administered 2018-07-06: 1 via ORAL
  Filled 2018-07-06: qty 1

## 2018-07-06 MED ORDER — SULFAMETHOXAZOLE-TRIMETHOPRIM 800-160 MG PO TABS
1.0000 | ORAL_TABLET | Freq: Once | ORAL | Status: AC
  Administered 2018-07-06: 1 via ORAL
  Filled 2018-07-06: qty 1

## 2018-07-06 MED ORDER — TRAMADOL HCL 50 MG PO TABS: 50.0000 mg | ORAL_TABLET | Freq: Four times a day (QID) | ORAL | 0 refills | Status: AC | PRN

## 2018-07-06 NOTE — ED Notes (Signed)
Patient transported to CT 

## 2018-07-06 NOTE — ED Provider Notes (Signed)
I assumed care of the patient from Dr. Stan Head at 11:00 PM with recommendation to follow-up on right upper quadrant ultrasound which revealed a contracted gallbladder.  I evaluated patient who had right CVA tenderness.  CT scan was performed which revealed no acute abnormality.  Based on urinalysis and clinical exam we will treat patient for pyelonephritis.  Patient prescribed Bactrim DS and tramadol for home.   Darci Current, MD 07/06/18 [phone]

## 2018-07-08 LAB — URINE CULTURE: Culture: >=100000 — AB

## 2019-07-28 ENCOUNTER — Other Ambulatory Visit: Payer: Self-pay

## 2019-07-28 ENCOUNTER — Emergency Department: Payer: No Typology Code available for payment source

## 2019-07-28 ENCOUNTER — Emergency Department
Admission: EM | Admit: 2019-07-28 | Discharge: 2019-07-28 | Disposition: A | Discharge status: Home / Self Care | Payer: No Typology Code available for payment source | Attending: Student | Admitting: Student

## 2019-07-28 ENCOUNTER — Encounter: Payer: Self-pay | Admitting: Emergency Medicine

## 2019-07-28 DIAGNOSIS — Y998 Other external cause status: Secondary | ICD-10-CM | POA: Insufficient documentation

## 2019-07-28 DIAGNOSIS — F17210 Nicotine dependence, cigarettes, uncomplicated: Secondary | ICD-10-CM | POA: Insufficient documentation

## 2019-07-28 DIAGNOSIS — M25572 Pain in left ankle and joints of left foot: Secondary | ICD-10-CM | POA: Insufficient documentation

## 2019-07-28 DIAGNOSIS — F1210 Cannabis abuse, uncomplicated: Secondary | ICD-10-CM | POA: Diagnosis not present

## 2019-07-28 DIAGNOSIS — Y92414 Local residential or business street as the place of occurrence of the external cause: Secondary | ICD-10-CM | POA: Diagnosis not present

## 2019-07-28 DIAGNOSIS — I252 Old myocardial infarction: Secondary | ICD-10-CM | POA: Insufficient documentation

## 2019-07-28 DIAGNOSIS — J45909 Unspecified asthma, uncomplicated: Secondary | ICD-10-CM | POA: Diagnosis not present

## 2019-07-28 DIAGNOSIS — F1410 Cocaine abuse, uncomplicated: Secondary | ICD-10-CM | POA: Diagnosis not present

## 2019-07-28 DIAGNOSIS — Y9389 Activity, other specified: Secondary | ICD-10-CM | POA: Diagnosis not present

## 2019-07-28 DIAGNOSIS — W19XXXA Unspecified fall, initial encounter: Secondary | ICD-10-CM | POA: Insufficient documentation

## 2019-07-28 MED ORDER — KETOROLAC TROMETHAMINE 30 MG/ML IJ SOLN
15.0000 mg | Freq: Once | INTRAMUSCULAR | Status: AC
  Administered 2019-07-28: 15 mg via INTRAMUSCULAR
  Filled 2019-07-28: qty 1

## 2019-07-28 MED ORDER — CYCLOBENZAPRINE HCL 5 MG PO TABS: ORAL_TABLET | ORAL | 0 refills | Status: DC
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
## 2019-12-27 ENCOUNTER — Ambulatory Visit: Payer: Self-pay | Admitting: Adult Health

## 2019-12-29 ENCOUNTER — Encounter: Payer: Self-pay | Admitting: Emergency Medicine

## 2019-12-29 ENCOUNTER — Other Ambulatory Visit: Payer: Self-pay

## 2019-12-29 ENCOUNTER — Emergency Department
Admission: EM | Admit: 2019-12-29 | Discharge: 2019-12-29 | Disposition: A | Discharge status: Home / Self Care | Payer: Medicaid Other | Attending: Emergency Medicine | Admitting: Emergency Medicine

## 2019-12-29 DIAGNOSIS — N61 Mastitis without abscess: Secondary | ICD-10-CM | POA: Insufficient documentation

## 2019-12-29 DIAGNOSIS — N644 Mastodynia: Secondary | ICD-10-CM | POA: Diagnosis present

## 2019-12-29 DIAGNOSIS — Z87891 Personal history of nicotine dependence: Secondary | ICD-10-CM | POA: Diagnosis not present

## 2019-12-29 DIAGNOSIS — N611 Abscess of the breast and nipple: Secondary | ICD-10-CM | POA: Insufficient documentation

## 2019-12-29 DIAGNOSIS — J45909 Unspecified asthma, uncomplicated: Secondary | ICD-10-CM | POA: Insufficient documentation

## 2019-12-29 LAB — CBC WITH DIFFERENTIAL/PLATELET
Abs Immature Granulocytes: 0.05 10*3/uL (ref 0.00–0.07)
Basophils Absolute: 0 10*3/uL (ref 0.0–0.1)
Basophils Relative: 0 %
Eosinophils Absolute: 0.2 10*3/uL (ref 0.0–0.5)
Eosinophils Relative: 2 %
HCT: 37 % (ref 36.0–46.0)
Hemoglobin: 12.4 g/dL (ref 12.0–15.0)
Immature Granulocytes: 1 %
Lymphocytes Relative: 11 %
Lymphs Abs: 1.3 10*3/uL (ref 0.7–4.0)
MCH: 29.6 pg (ref 26.0–34.0)
MCHC: 33.5 g/dL (ref 30.0–36.0)
MCV: 88.3 fL (ref 80.0–100.0)
Monocytes Absolute: 0.6 10*3/uL (ref 0.1–1.0)
Monocytes Relative: 6 %
Neutro Abs: 8.8 10*3/uL — ABNORMAL HIGH (ref 1.7–7.7)
Neutrophils Relative %: 80 %
Platelets: 332 10*3/uL (ref 150–400)
RBC: 4.19 MIL/uL (ref 3.87–5.11)
RDW: 12.5 % (ref 11.5–15.5)
WBC: 11 10*3/uL — ABNORMAL HIGH (ref 4.0–10.5)
nRBC: 0 % (ref 0.0–0.2)

## 2019-12-29 LAB — COMPREHENSIVE METABOLIC PANEL
ALT: 36 U/L (ref 0–44)
AST: 48 U/L — ABNORMAL HIGH (ref 15–41)
Albumin: 3.9 g/dL (ref 3.5–5.0)
Alkaline Phosphatase: 65 U/L (ref 38–126)
Anion gap: 13 (ref 5–15)
BUN: 10 mg/dL (ref 6–20)
CO2: 24 mmol/L (ref 22–32)
Calcium: 8.7 mg/dL — ABNORMAL LOW (ref 8.9–10.3)
Chloride: 97 mmol/L — ABNORMAL LOW (ref 98–111)
Creatinine, Ser: 0.71 mg/dL (ref 0.44–1.00)
GFR, Estimated: >60 mL/min (ref >60)
Glucose, Bld: 122 mg/dL — ABNORMAL HIGH (ref 70–99)
Potassium: 3.3 mmol/L — ABNORMAL LOW (ref 3.5–5.1)
Sodium: 134 mmol/L — ABNORMAL LOW (ref 135–145)
Total Bilirubin: 0.9 mg/dL (ref 0.3–1.2)
Total Protein: 7.5 g/dL (ref 6.5–8.1)

## 2019-12-29 LAB — URINALYSIS, COMPLETE (UACMP) WITH MICROSCOPIC
Bacteria, UA: NONE SEEN
RBC / HPF: >50 RBC/hpf — ABNORMAL HIGH (ref 0–5)
Specific Gravity, Urine: 1.025 (ref 1.005–1.030)
WBC, UA: >50 WBC/hpf — ABNORMAL HIGH (ref 0–5)

## 2019-12-29 LAB — LACTIC ACID, PLASMA: Lactic Acid, Venous: 1.2 mmol/L (ref 0.5–1.9)

## 2019-12-29 LAB — POC URINE PREG, ED: Preg Test, Ur: NEGATIVE

## 2019-12-29 MED ORDER — DOXYCYCLINE HYCLATE 50 MG PO CAPS: 100.0000 mg | ORAL_CAPSULE | Freq: Two times a day (BID) | ORAL | 0 refills | Status: AC

## 2019-12-29 NOTE — ED Provider Notes (Signed)
[facility] Emergency Department Provider Note   ____________________________________________   First MD Initiated Contact with Patient 12/29/19 1625     (approximate)  I have reviewed the triage vital signs and the nursing notes.   HISTORY  Chief Complaint Abscess    HPI Tammy Hickman is a 29 y.o. female with a stated past medical history of anxiety and bipolar disorder who presents for inflammation, irritation, redness, and significant pain to the right breast approximately 5 days after seeing a bug bite to this breast.  Patient states that since the time when she first noticed this bug bite the area around it has been having spreading redness and now developed an area of significant tenderness just superior to the right nipple.  Patient denies any active drainage.  Patient does endorse subjective fevers/chills.  Patient denies any nausea/vomiting.         Past Medical History:  Diagnosis Date  . Anxiety   . Asthma   . Bipolar disorder ([facility])   . Hernia, umbilical 2011  . Mental disorders of mother, antepartum   . Obesity complicating pregnancy, childbirth, or the puerperium, antepartum condition or complication(649.13)   . Other specified indication for care or intervention related to labor and delivery, antepartum   . Thyroid dysfunction     Patient Active Problem List   Diagnosis Date Noted  . Pyelonephritis 12/08/2015  . Cocaine abuse ([facility])   . Elevated troponin I level   . Leg swelling   . NSTEMI (non-ST elevated myocardial infarction) ([facility]) 11/03/2015  . Low back pain 01/13/2015  . Leg pain, right 01/13/2015  . Opiate overdose ([facility]) 10/31/2014  . Dysthymia 10/31/2014  . ADHD (attention deficit hyperactivity disorder) 10/31/2014  . Obesity 07/27/2011  . Contraception, generic surveillance 07/27/2011    Past Surgical History:  Procedure Laterality Date  . ABDOMINAL SURGERY    . BACK SURGERY    . HERNIA REPAIR      umbilical hernia  . TONSILLECTOMY  2009    Prior to Admission medications   Medication Sig Start Date End Date Taking? Authorizing Provider  acetaminophen (TYLENOL) 325 MG tablet Take 2 tablets (650 mg total) by mouth every 6 (six) hours as needed for moderate pain. 10/09/15   Hagler, Jami L, PA-C  albuterol (PROVENTIL HFA;VENTOLIN HFA) 108 (90 Base) MCG/ACT inhaler Inhale 2 puffs into the lungs every 6 (six) hours as needed for wheezing or shortness of breath. 12/26/17   Tommi Rumps, PA-C  cyclobenzaprine (FLEXERIL) 5 MG tablet Take 1-2 tablets 3 times daily as needed 07/28/19   Enid Derry, PA-C  doxycycline (VIBRAMYCIN) 50 MG capsule Take 2 capsules (100 mg total) by mouth 2 (two) times daily for 5 days. 12/29/19 01/03/20  Merwyn Katos, MD    Allergies Penicillins, Prednisone, Fentanyl, Meloxicam, and Hydromorphone  Family History  Problem Relation Age of Onset  . Cancer Paternal Grandfather        Esophageal  . Heart disease Paternal Grandmother   . Diabetes Maternal Grandmother   . Heart disease Maternal Grandfather   . Diabetes Maternal Grandfather   . Cancer Maternal Grandfather        Esophageal  . Heart disease Maternal Aunt        Fatal MI in late-30's    Social History Social History   Tobacco Use  . Smoking status: Former Smoker    Packs/day: 0.20    Quit date: 11/14/2009    Years since quitting: 10.1  .  Smokeless tobacco: Never Used  Substance Use Topics  . Alcohol use: No    Alcohol/week: 0.0 standard drinks  . Drug use: Yes    Types: Cocaine, Marijuana    Comment: Pain pills    Review of Systems Constitutional: No fever/chills Eyes: No visual changes. ENT: No sore throat. Cardiovascular: Denies chest pain. Respiratory: Denies shortness of breath. Gastrointestinal: No abdominal pain.  No nausea, no vomiting.  No diarrhea. Genitourinary: Negative for dysuria. Musculoskeletal: Negative for acute arthralgias Skin: Positive for rash to right  breast Neurological: Negative for headaches, weakness/numbness/paresthesias in any extremity Psychiatric: Negative for suicidal ideation/homicidal ideation   ____________________________________________   PHYSICAL EXAM:  VITAL SIGNS: ED Triage Vitals  Enc Vitals Group     BP 12/29/19 1527 (!) 150/85     Pulse Rate 12/29/19 1527 (!) 134     Resp 12/29/19 1527 18     Temp 12/29/19 1527 99.3 F (37.4 C)     Temp Source 12/29/19 1527 Oral     SpO2 12/29/19 1527 98 %     Weight 12/29/19 1528 265 lb (120.2 kg)     Height 12/29/19 1528 5\' 8"  (1.727 m)     Head Circumference --      Peak Flow --      Pain Score 12/29/19 1527 9     Pain Loc --      Pain Edu? --      Excl. in GC? --    Constitutional: Alert and oriented. Well appearing and in no acute distress. Eyes: Conjunctivae are normal. PERRL. Head: Atraumatic. Nose: No congestion/rhinnorhea. Mouth/Throat: Mucous membranes are moist. Neck: No stridor Cardiovascular: Grossly normal heart sounds.  Good peripheral circulation. Respiratory: Normal respiratory effort.  No retractions. Gastrointestinal: Soft and nontender. No distention. Musculoskeletal: No obvious deformities Neurologic:  Normal speech and language. No gross focal neurologic deficits are appreciated. Skin:  Skin is warm and dry.  Erythema, induration, and tenderness to palpation over the entirety of the right breast that is worse superior to the right nipple with an area of fluctuation Bedside ultrasound does show a fluid collection of approximately 4 cm x 2 cm x 2 cm Psychiatric: Mood and affect are normal. Speech and behavior are normal.  ____________________________________________   LABS (all labs ordered are listed, but only abnormal results are displayed)  Labs Reviewed  COMPREHENSIVE METABOLIC PANEL - Abnormal; Notable for the following components:      Result Value   Sodium 134 (*)    Potassium 3.3 (*)    Chloride 97 (*)    Glucose, Bld 122 (*)      Calcium 8.7 (*)    AST 48 (*)    All other components within normal limits  CBC WITH DIFFERENTIAL/PLATELET - Abnormal; Notable for the following components:   WBC 11.0 (*)    Neutro Abs 8.8 (*)    All other components within normal limits  URINALYSIS, COMPLETE (UACMP) WITH MICROSCOPIC - Abnormal; Notable for the following components:   Color, Urine RED (*)    APPearance CLOUDY (*)    Glucose, UA   (*)    Value: TEST NOT REPORTED DUE TO COLOR INTERFERENCE OF URINE PIGMENT   Hgb urine dipstick   (*)    Value: TEST NOT REPORTED DUE TO COLOR INTERFERENCE OF URINE PIGMENT   Bilirubin Urine   (*)    Value: TEST NOT REPORTED DUE TO COLOR INTERFERENCE OF URINE PIGMENT   Ketones, ur   (*)    Value:  TEST NOT REPORTED DUE TO COLOR INTERFERENCE OF URINE PIGMENT   Protein, ur   (*)    Value: TEST NOT REPORTED DUE TO COLOR INTERFERENCE OF URINE PIGMENT   Nitrite   (*)    Value: TEST NOT REPORTED DUE TO COLOR INTERFERENCE OF URINE PIGMENT   Leukocytes,Ua   (*)    Value: TEST NOT REPORTED DUE TO COLOR INTERFERENCE OF URINE PIGMENT   RBC / HPF >50 (*)    WBC, UA >50 (*)    All other components within normal limits  LACTIC ACID, PLASMA  POC URINE PREG, ED     PROCEDURES  Procedure(s) performed (including Critical Care):  Marland KitchenMarland KitchenIncision and Drainage  Date/Time: 12/29/2019 5:46 PM Performed by: Merwyn Katos, MD Authorized by: Merwyn Katos, MD   Consent:    Consent obtained:  Verbal   Consent given by:  Patient   Risks discussed:  Bleeding, incomplete drainage, pain and infection   Alternatives discussed:  No treatment, delayed treatment, alternative treatment and observation Location:    Type:  Abscess   Size:  4x2x2cm   Location:  Trunk   Trunk location:  R breast Pre-procedure details:    Skin preparation:  Betadine Anesthesia (see MAR for exact dosages):    Anesthesia method:  Local infiltration   Local anesthetic:  Lidocaine 1% WITH epi Procedure type:    Complexity:   Complex Procedure details:    Needle aspiration: no     Incision types:  Single straight   Incision depth:  Submucosal   Scalpel blade:  11   Wound management:  Probed and deloculated and irrigated with saline   Drainage:  Purulent and bloody   Drainage amount:  Copious   Wound treatment:  Wound left open   Packing materials:  1/4 in iodoform gauze   Amount 1/4" iodoform:  4cm Post-procedure details:    Patient tolerance of procedure:  Tolerated well, no immediate complications .1-3 Lead EKG Interpretation Performed by: Merwyn Katos, MD Authorized by: Merwyn Katos, MD     Interpretation: abnormal     ECG rate:  105   ECG rate assessment: tachycardic     Rhythm: sinus tachycardia     Ectopy: none     Conduction: normal       ____________________________________________   INITIAL IMPRESSION / ASSESSMENT AND PLAN / ED COURSE  As part of my medical decision making, I reviewed the following data within the electronic MEDICAL RECORD NUMBER Nursing notes reviewed and incorporated, Labs reviewed, Old chart reviewed, and Notes from prior ED visits reviewed and incorporated        Presentation most consistent with simple abscess with surrounding mastitis.  Incision and drainage was performed on this abscess.  Please see procedure note for full details Given History, Exam, and Workup I have low suspicion for Necrotizing Fasciitis, Osteomyelitis, DVT or other emergent problem as a cause for this presentation.  Rx: Doxycycline 100 mg twice daily x5 days  Disposition: Discharge. No evidence of serious bacterial illness. Nontoxic appearing, VSS. Low risk for treatment failure based on history. Strict return precautions discussed with patient with full understanding. Advised patient to follow up promptly with primary care provider within next 48 hours.      ____________________________________________   FINAL CLINICAL IMPRESSION(S) / ED DIAGNOSES  Final diagnoses:  Abscess of  right breast  Cellulitis of right breast     ED Discharge Orders         Ordered  doxycycline (VIBRAMYCIN) 50 MG capsule  2 times daily        12/29/19 1740           Note:  This document was prepared using Dragon voice recognition software and may include unintentional dictation errors.   Merwyn KatosBradler, Sabriel Borromeo K, MD 12/29/19 [phone]

## 2019-12-29 NOTE — ED Triage Notes (Addendum)
Pt arrived via POV with reports of R breast abscess, pt states she first noticed it 3 days ago thought it was a bug bite and now states the swelling is worse all over right breast.   Pt unable to sit still in triage, talkative, appears anxious.  Pt also states she is having a reaction on her face from new face wash.  Pt arrived with multiple bags and currently not wearing shoes. Has what appears to be a brand new winter boots with tags on them with her.

## 2019-12-30 ENCOUNTER — Emergency Department
Admission: EM | Admit: 2019-12-30 | Discharge: 2019-12-30 | Disposition: A | Discharge status: Home / Self Care | Payer: Medicaid Other | Attending: Emergency Medicine | Admitting: Emergency Medicine

## 2019-12-30 DIAGNOSIS — N611 Abscess of the breast and nipple: Secondary | ICD-10-CM

## 2019-12-30 DIAGNOSIS — E876 Hypokalemia: Secondary | ICD-10-CM

## 2019-12-30 DIAGNOSIS — J45909 Unspecified asthma, uncomplicated: Secondary | ICD-10-CM | POA: Diagnosis not present

## 2019-12-30 DIAGNOSIS — Z87891 Personal history of nicotine dependence: Secondary | ICD-10-CM | POA: Diagnosis not present

## 2019-12-30 DIAGNOSIS — N61 Mastitis without abscess: Secondary | ICD-10-CM | POA: Diagnosis not present

## 2019-12-30 LAB — CBC WITH DIFFERENTIAL/PLATELET
Abs Immature Granulocytes: 0.04 10*3/uL (ref 0.00–0.07)
Basophils Absolute: 0 10*3/uL (ref 0.0–0.1)
Basophils Relative: 0 %
Eosinophils Absolute: 0.5 10*3/uL (ref 0.0–0.5)
Eosinophils Relative: 5 %
HCT: 36 % (ref 36.0–46.0)
Hemoglobin: 11.9 g/dL — ABNORMAL LOW (ref 12.0–15.0)
Immature Granulocytes: 0 %
Lymphocytes Relative: 20 %
Lymphs Abs: 2.1 10*3/uL (ref 0.7–4.0)
MCH: 29.7 pg (ref 26.0–34.0)
MCHC: 33.1 g/dL (ref 30.0–36.0)
MCV: 89.8 fL (ref 80.0–100.0)
Monocytes Absolute: 0.7 10*3/uL (ref 0.1–1.0)
Monocytes Relative: 7 %
Neutro Abs: 6.9 10*3/uL (ref 1.7–7.7)
Neutrophils Relative %: 68 %
Platelets: 314 10*3/uL (ref 150–400)
RBC: 4.01 MIL/uL (ref 3.87–5.11)
RDW: 12.6 % (ref 11.5–15.5)
WBC: 10.3 10*3/uL (ref 4.0–10.5)
nRBC: 0 % (ref 0.0–0.2)

## 2019-12-30 LAB — BASIC METABOLIC PANEL
Anion gap: 10 (ref 5–15)
BUN: 13 mg/dL (ref 6–20)
CO2: 25 mmol/L (ref 22–32)
Calcium: 8.8 mg/dL — ABNORMAL LOW (ref 8.9–10.3)
Chloride: 102 mmol/L (ref 98–111)
Creatinine, Ser: 0.74 mg/dL (ref 0.44–1.00)
GFR, Estimated: >60 mL/min (ref >60)
Glucose, Bld: 108 mg/dL — ABNORMAL HIGH (ref 70–99)
Potassium: 3 mmol/L — ABNORMAL LOW (ref 3.5–5.1)
Sodium: 137 mmol/L (ref 135–145)

## 2019-12-30 MED ORDER — POTASSIUM CHLORIDE CRYS ER 20 MEQ PO TBCR
40.0000 meq | EXTENDED_RELEASE_TABLET | Freq: Once | ORAL | Status: AC
  Administered 2019-12-30: 40 meq via ORAL
  Filled 2019-12-30: qty 2

## 2019-12-30 MED ORDER — POTASSIUM CHLORIDE ER 20 MEQ PO TBCR
10.0000 meq | EXTENDED_RELEASE_TABLET | Freq: Every day | ORAL | 0 refills | Status: DC
Start: 2019-12-30 — End: 2023-01-06

## 2019-12-30 MED ORDER — CLINDAMYCIN PHOSPHATE 600 MG/4ML IJ SOLN: 600.0000 mg | Freq: Once | INTRAMUSCULAR | Status: DC

## 2019-12-30 MED ORDER — CLINDAMYCIN PHOSPHATE 300 MG/2ML IJ SOLN
600.0000 mg | Freq: Once | INTRAMUSCULAR | Status: AC
  Administered 2019-12-30: 600 mg via INTRAMUSCULAR
  Filled 2019-12-30: qty 4

## 2019-12-30 NOTE — ED Notes (Signed)
Patient noted to be back in lobby.

## 2019-12-30 NOTE — ED Notes (Addendum)
NAD noted at time of D/C. Pt denies questions or concerns. Pt ambulatory to the lobby at this time. Verbal consent for D/C obtained by this RN at this time.  ° °

## 2019-12-30 NOTE — Discharge Instructions (Addendum)
Follow-up with your primary care provider return to the emergency department for reevaluation in 24 hours.  Begin taking the antibiotic as directed and prescribed for you yesterday.  Also your potassium is low and a prescription for potassium was sent to your pharmacy.  This is 1 daily.  You had your first dose while in the emergency department so the next dose will be tomorrow.  Return to the emergency department if any severe worsening of your symptoms or fever over 100.

## 2019-12-30 NOTE — ED Notes (Signed)
Pt presents to ED via POV with c/o worsening redness/odor since having abscess to R breast I&D yesterday. Pt states initially felt better, now states feels like has a foul odor. Pt with noted marking around the redness, no significant extending of redness past line. Packing in place at this time. Serosanguinous drainage noted to dressing at this time.   Pt noted to have difficulty sitting still during assessment, constantly moving. Pt with noted bruising in various places around her body.

## 2019-12-30 NOTE — ED Notes (Signed)
Patient no longer sitting in wheelchair in waiting room.  Unable to locate patient in waiting room or rest room.

## 2019-12-30 NOTE — ED Triage Notes (Signed)
Patient reports had abscess to right breast I&D'd early.  Now with increased redness, pain and drainage.

## 2019-12-30 NOTE — ED Provider Notes (Signed)
[facility] Emergency Department Provider Note  ____________________________________________   First MD Initiated Contact with Patient 12/30/19 [phone]     (approximate)  I have reviewed the triage vital signs and the nursing notes.   HISTORY  Chief Complaint Abscess   HPI Tammy Hickman is a 29 y.o. female presents to the ED with complaint of redness to her right breast.  Patient was seen yesterday for an abscess to her right breast and the area was I&D and packed with iodoform gauze.  Patient states that it has continued to drain.  Patient states that she has been taking an antibiotic that was given to her previously for a urinary tract infection however when she was asked specifically what it was she replied she did not know whether it was her mother's or her medication and does not know the name.  It is also unclear as to whether she got the prescription that was given to her yesterday field.  Patient denies any fever, chills, nausea or vomiting.       Past Medical History:  Diagnosis Date  . Anxiety   . Asthma   . Bipolar disorder (HCC)   . Hernia, umbilical 2011  . Mental disorders of mother, antepartum   . Obesity complicating pregnancy, childbirth, or the puerperium, antepartum condition or complication(649.13)   . Other specified indication for care or intervention related to labor and delivery, antepartum   . Thyroid dysfunction     Patient Active Problem List   Diagnosis Date Noted  . Pyelonephritis 12/08/2015  . Cocaine abuse (HCC)   . Elevated troponin I level   . Leg swelling   . NSTEMI (non-ST elevated myocardial infarction) (HCC) 11/03/2015  . Low back pain 01/13/2015  . Leg pain, right 01/13/2015  . Opiate overdose (HCC) 10/31/2014  . Dysthymia 10/31/2014  . ADHD (attention deficit hyperactivity disorder) 10/31/2014  . Obesity 07/27/2011  . Contraception, generic surveillance 07/27/2011    Past Surgical History:    Procedure Laterality Date  . ABDOMINAL SURGERY    . BACK SURGERY    . HERNIA REPAIR     umbilical hernia  . TONSILLECTOMY  2009    Prior to Admission medications   Medication Sig Start Date End Date Taking? Authorizing Provider  acetaminophen (TYLENOL) 325 MG tablet Take 2 tablets (650 mg total) by mouth every 6 (six) hours as needed for moderate pain. 10/09/15   Hagler, Jami L, PA-C  albuterol (PROVENTIL HFA;VENTOLIN HFA) 108 (90 Base) MCG/ACT inhaler Inhale 2 puffs into the lungs every 6 (six) hours as needed for wheezing or shortness of breath. 12/26/17   Tommi Rumps, PA-C  doxycycline (VIBRAMYCIN) 50 MG capsule Take 2 capsules (100 mg total) by mouth 2 (two) times daily for 5 days. 12/29/19 01/03/20  Merwyn Katos, MD  potassium chloride 20 MEQ TBCR Take 10 mEq by mouth daily. 12/30/19   Tommi Rumps, PA-C    Allergies Penicillins, Prednisone, Fentanyl, Meloxicam, and Hydromorphone  Family History  Problem Relation Age of Onset  . Cancer Paternal Grandfather        Esophageal  . Heart disease Paternal Grandmother   . Diabetes Maternal Grandmother   . Heart disease Maternal Grandfather   . Diabetes Maternal Grandfather   . Cancer Maternal Grandfather        Esophageal  . Heart disease Maternal Aunt        Fatal MI in late-30's    Social History Social History  Tobacco Use  . Smoking status: Former Smoker    Packs/day: 0.20    Quit date: 11/14/2009    Years since quitting: 10.1  . Smokeless tobacco: Never Used  Substance Use Topics  . Alcohol use: No    Alcohol/week: 0.0 standard drinks  . Drug use: Yes    Types: Cocaine, Marijuana    Comment: Pain pills    Review of Systems Constitutional: No fever/chills Eyes: No visual changes. Cardiovascular: Denies chest pain. Respiratory: Denies shortness of breath. Gastrointestinal: No abdominal pain.  No nausea, no vomiting. Musculoskeletal: Negative for back pain.  Skin: Positive for abscess right  breast. Neurological: Negative for headaches, focal weakness or numbness. Psychiatric:  Positive for anxiety, bipolar disorder, cocaine abuse, opiate overdose. ____________________________________________   PHYSICAL EXAM:  VITAL SIGNS: ED Triage Vitals [12/30/19 0211]  Enc Vitals Group     BP (!) 137/113     Pulse Rate (!) 110     Resp (!) 22     Temp 99.1 F (37.3 C)     Temp Source Oral     SpO2 99 %     Weight 270 lb (122.5 kg)     Height 5\' 8"  (1.727 m)     Head Circumference      Peak Flow      Pain Score      Pain Loc      Pain Edu?      Excl. in GC?     Constitutional: Alert and oriented. Well appearing and in no acute distress. Eyes: Conjunctivae are normal.  Head: Atraumatic. Nose: No congestion/rhinnorhea. Neck: No stridor.   Cardiovascular: Normal rate, regular rhythm. Grossly normal heart sounds.  Good peripheral circulation. Respiratory: Normal respiratory effort.  No retractions. Lungs CTAB. Musculoskeletal: Moves upper and lower extremities with any difficulty and patient is able to ambulate without any assistance. Neurologic:  Normal speech and language. No gross focal neurologic deficits are appreciated. No gait instability. Skin:  Skin is warm, dry.  On examination of the right breast there is an erythematous area/abscess that is continuing to drain purulent material.  No extending cellulitis is observed.  Patient has multiple areas on her face upper and lower extremities where she has constantly picked at them.  There is no drainage from these areas.  Patient continues to do so even while talking with her and examination.  Patient was also noticed to have erythema on her right forearm but no warmth or pain was noted on palpation.  On further inspection it is noted that patient was using make up to make her arm look red.  Psychiatric: Mood and affect are normal.   ____________________________________________   LABS (all labs ordered are listed, but only  abnormal results are displayed)  Labs Reviewed  BASIC METABOLIC PANEL - Abnormal; Notable for the following components:      Result Value   Potassium 3.0 (*)    Glucose, Bld 108 (*)    Calcium 8.8 (*)    All other components within normal limits  CBC WITH DIFFERENTIAL/PLATELET - Abnormal; Notable for the following components:   Hemoglobin 11.9 (*)    All other components within normal limits    PROCEDURES  Procedure(s) performed (including Critical Care):  Procedures   ____________________________________________   INITIAL IMPRESSION / ASSESSMENT AND PLAN / ED COURSE  As part of my medical decision making, I reviewed the following data within the electronic MEDICAL RECORD NUMBER Notes from prior ED visits and [place] Controlled Substance Database  29 year old female presents to the ED with anxiety about her abscess to her right breast.  Is unclear both to the nurse and myself whether or not she is taking the correct antibiotic because she has a history of taking antibiotics that either belong to her or her mother for something else.  When asked how many tablets she is taken of the most recently prescribed antibiotic this does not add up with the doctors notes.  Lab work was reassuring and actually is improved since yesterday's visit.  Patient was given clindamycin 600 mg IM while in the ED also lab work does show that patient is hypokalemic and she was given an oral dose of potassium while in the ED.  A prescription for the same was sent to her pharmacy.  She is encouraged to take the antibiotic that was written for her yesterday as prescribed on the bottle and also to return as directed for removal of the drain.  ____________________________________________   FINAL CLINICAL IMPRESSION(S) / ED DIAGNOSES  Final diagnoses:  Abscess of right breast  Cellulitis of right breast  Hypokalemia     ED Discharge Orders         Ordered    potassium chloride 20 MEQ TBCR  Daily        12/30/19  0911          *Please note:  Tammy Hickman was evaluated in Emergency Department on 12/30/2019 for the symptoms described in the history of present illness. She was evaluated in the context of the global COVID-19 pandemic, which necessitated consideration that the patient might be at risk for infection with the SARS-CoV-2 virus that causes COVID-19. Institutional protocols and algorithms that pertain to the evaluation of patients at risk for COVID-19 are in a state of rapid change based on information released by regulatory bodies including the CDC and federal and state organizations. These policies and algorithms were followed during the patient's care in the ED.  Some ED evaluations and interventions may be delayed as a result of limited staffing during and the pandemic.*   Note:  This document was prepared using Dragon voice recognition software and may include unintentional dictation errors.    Tommi Rumps, PA-C 12/30/19 1256    Shaune Pollack, MD 12/30/19 2045

## 2020-03-23 ENCOUNTER — Emergency Department
Admission: EM | Admit: 2020-03-23 | Discharge: 2020-03-23 | Disposition: A | Discharge status: Home / Self Care | Payer: Medicaid Other | Attending: Emergency Medicine | Admitting: Emergency Medicine

## 2020-03-23 ENCOUNTER — Other Ambulatory Visit: Payer: Self-pay

## 2020-03-23 ENCOUNTER — Emergency Department: Payer: Medicaid Other

## 2020-03-23 ENCOUNTER — Encounter: Payer: Self-pay | Admitting: Emergency Medicine

## 2020-03-23 DIAGNOSIS — Z79899 Other long term (current) drug therapy: Secondary | ICD-10-CM | POA: Insufficient documentation

## 2020-03-23 DIAGNOSIS — L02414 Cutaneous abscess of left upper limb: Secondary | ICD-10-CM | POA: Diagnosis not present

## 2020-03-23 DIAGNOSIS — L02415 Cutaneous abscess of right lower limb: Secondary | ICD-10-CM | POA: Insufficient documentation

## 2020-03-23 DIAGNOSIS — L0291 Cutaneous abscess, unspecified: Secondary | ICD-10-CM

## 2020-03-23 DIAGNOSIS — Z87891 Personal history of nicotine dependence: Secondary | ICD-10-CM | POA: Diagnosis not present

## 2020-03-23 DIAGNOSIS — J45909 Unspecified asthma, uncomplicated: Secondary | ICD-10-CM | POA: Insufficient documentation

## 2020-03-23 LAB — CBC WITH DIFFERENTIAL/PLATELET
Abs Immature Granulocytes: 0.05 10*3/uL (ref 0.00–0.07)
Basophils Absolute: 0 10*3/uL (ref 0.0–0.1)
Basophils Relative: 0 %
Eosinophils Absolute: 0.3 10*3/uL (ref 0.0–0.5)
Eosinophils Relative: 4 %
HCT: 42 % (ref 36.0–46.0)
Hemoglobin: 13.4 g/dL (ref 12.0–15.0)
Immature Granulocytes: 1 %
Lymphocytes Relative: 19 %
Lymphs Abs: 1.4 10*3/uL (ref 0.7–4.0)
MCH: 27.3 pg (ref 26.0–34.0)
MCHC: 31.9 g/dL (ref 30.0–36.0)
MCV: 85.7 fL (ref 80.0–100.0)
Monocytes Absolute: 0.4 10*3/uL (ref 0.1–1.0)
Monocytes Relative: 6 %
Neutro Abs: 5.4 10*3/uL (ref 1.7–7.7)
Neutrophils Relative %: 70 %
Platelets: UNDETERMINED 10*3/uL (ref 150–400)
RBC: 4.9 MIL/uL (ref 3.87–5.11)
RDW: 13.3 % (ref 11.5–15.5)
WBC: 7.6 10*3/uL (ref 4.0–10.5)
nRBC: 0 % (ref 0.0–0.2)

## 2020-03-23 LAB — COMPREHENSIVE METABOLIC PANEL
ALT: 27 U/L (ref 0–44)
AST: 27 U/L (ref 15–41)
Albumin: 3.3 g/dL — ABNORMAL LOW (ref 3.5–5.0)
Alkaline Phosphatase: 56 U/L (ref 38–126)
Anion gap: 11 (ref 5–15)
BUN: 14 mg/dL (ref 6–20)
CO2: 24 mmol/L (ref 22–32)
Calcium: 9.1 mg/dL (ref 8.9–10.3)
Chloride: 105 mmol/L (ref 98–111)
Creatinine, Ser: 0.49 mg/dL (ref 0.44–1.00)
GFR, Estimated: >60 mL/min (ref >60)
Glucose, Bld: 87 mg/dL (ref 70–99)
Potassium: 3.8 mmol/L (ref 3.5–5.1)
Sodium: 140 mmol/L (ref 135–145)
Total Bilirubin: 0.5 mg/dL (ref 0.3–1.2)
Total Protein: 7.3 g/dL (ref 6.5–8.1)

## 2020-03-23 MED ORDER — SULFAMETHOXAZOLE-TRIMETHOPRIM 800-160 MG PO TABS
1.0000 | ORAL_TABLET | Freq: Once | ORAL | Status: AC
  Administered 2020-03-23: 1 via ORAL
  Filled 2020-03-23: qty 1

## 2020-03-23 MED ORDER — LIDOCAINE HCL (PF) 1 % IJ SOLN
INTRAMUSCULAR | Status: AC
  Administered 2020-03-23: 30 mL
  Filled 2020-03-23: qty 10

## 2020-03-23 MED ORDER — HYDROCODONE-ACETAMINOPHEN 5-325 MG PO TABS
1.0000 | ORAL_TABLET | Freq: Once | ORAL | Status: AC
  Administered 2020-03-23: 1 via ORAL
  Filled 2020-03-23: qty 1

## 2020-03-23 MED ORDER — SULFAMETHOXAZOLE-TRIMETHOPRIM 800-160 MG PO TABS: 1.0000 | ORAL_TABLET | Freq: Two times a day (BID) | ORAL | 0 refills | Status: AC

## 2020-03-23 MED ORDER — LIDOCAINE HCL (PF) 1 % IJ SOLN
30.0000 mL | Freq: Once | INTRAMUSCULAR | Status: AC
  Filled 2020-03-23: qty 30

## 2020-03-23 NOTE — ED Triage Notes (Signed)
Pt reports back in October she was bit by a spider and seen here but the hospital did not get all the infection out and she ended up in the ICU at [facility]. Pt states she asked [facility] to check and make sure they did not give her MRSA before she left but they didn't and now she has 3 abscesses that are painful. Pt with abscess to left arm that is currently draining. Pt with abscess to left chest that she drained already and is not scabbed over and pt with abscess to right ankle. Pt reports also fell on the ice and is concerned she may have broken her ankle as well.

## 2020-03-23 NOTE — ED Provider Notes (Signed)
[facility] Emergency Department Provider Note  ____________________________________________   I have reviewed the triage vital signs and the nursing notes.   HISTORY  Chief Complaint Abscess   History limited by: Not Limited   HPI Tammy Hickman is a 30 y.o. female who presents to the emergency department today because of concerns for abscesses.  Patient states that she has abscess both around her left elbow and her right ankle.  The patient has history of hospitalization at [facility] for a breast abscess.  She states that the left elbow started coming up a few days ago.  It did start to spontaneously drain last night.  Right ankle also has been present for a couple of days.  Additionally she has concerns for possible right ankle injury having slipped on the ice and then hitting against the bed.  Patient denies any fevers.  Has had some nausea.  Records reviewed. Per medical record review patient has a history of admission last fall for breast abcess  Past Medical History:  Diagnosis Date  . Anxiety   . Asthma   . Bipolar disorder (HCC)   . Hernia, umbilical 2011  . Mental disorders of mother, antepartum   . Obesity complicating pregnancy, childbirth, or the puerperium, antepartum condition or complication(649.13)   . Other specified indication for care or intervention related to labor and delivery, antepartum   . Thyroid dysfunction     Patient Active Problem List   Diagnosis Date Noted  . Pyelonephritis 12/08/2015  . Cocaine abuse (HCC)   . Elevated troponin I level   . Leg swelling   . NSTEMI (non-ST elevated myocardial infarction) (HCC) 11/03/2015  . Low back pain 01/13/2015  . Leg pain, right 01/13/2015  . Opiate overdose (HCC) 10/31/2014  . Dysthymia 10/31/2014  . ADHD (attention deficit hyperactivity disorder) 10/31/2014  . Obesity 07/27/2011  . Contraception, generic surveillance 07/27/2011    Past Surgical History:  Procedure  Laterality Date  . ABDOMINAL SURGERY    . BACK SURGERY    . HERNIA REPAIR     umbilical hernia  . TONSILLECTOMY  2009    Prior to Admission medications   Medication Sig Start Date End Date Taking? Authorizing Provider  acetaminophen (TYLENOL) 325 MG tablet Take 2 tablets (650 mg total) by mouth every 6 (six) hours as needed for moderate pain. 10/09/15   Hagler, Jami L, PA-C  albuterol (PROVENTIL HFA;VENTOLIN HFA) 108 (90 Base) MCG/ACT inhaler Inhale 2 puffs into the lungs every 6 (six) hours as needed for wheezing or shortness of breath. 12/26/17   Tommi Rumps, PA-C  potassium chloride 20 MEQ TBCR Take 10 mEq by mouth daily. 12/30/19   Tommi Rumps, PA-C    Allergies Penicillins, Prednisone, Fentanyl, Meloxicam, and Hydromorphone  Family History  Problem Relation Age of Onset  . Cancer Paternal Grandfather        Esophageal  . Heart disease Paternal Grandmother   . Diabetes Maternal Grandmother   . Heart disease Maternal Grandfather   . Diabetes Maternal Grandfather   . Cancer Maternal Grandfather        Esophageal  . Heart disease Maternal Aunt        Fatal MI in late-30's    Social History Social History   Tobacco Use  . Smoking status: Former Smoker    Packs/day: 0.20    Quit date: 11/14/2009    Years since quitting: 10.3  . Smokeless tobacco: Never Used  Substance Use Topics  .  Alcohol use: No    Alcohol/week: 0.0 standard drinks  . Drug use: Yes    Types: Cocaine, Marijuana    Comment: Pain pills    Review of Systems Constitutional: No fever/chills Eyes: No visual changes. ENT: No sore throat. Cardiovascular: Denies chest pain. Respiratory: Denies shortness of breath. Gastrointestinal: No abdominal pain.  No nausea, no vomiting.  No diarrhea.   Genitourinary: Negative for dysuria. Musculoskeletal: Right ankle pain. Skin: Positive for swelling and pain to left elbow, right ankle Neurological: Negative for headaches, focal weakness or  numbness.  ____________________________________________   PHYSICAL EXAM:  VITAL SIGNS: ED Triage Vitals  Enc Vitals Group     BP 03/23/20 1508 134/72     Pulse Rate 03/23/20 1505 (!) 104     Resp 03/23/20 1505 20     Temp 03/23/20 1505 98.7 F (37.1 C)     Temp Source 03/23/20 1505 Oral     SpO2 03/23/20 1505 98 %     Weight 03/23/20 1505 262 lb (118.8 kg)     Height 03/23/20 1505 5\' 8"  (1.727 m)     Head Circumference --      Peak Flow --      Pain Score 03/23/20 1505 9    Constitutional: Alert and oriented.  Eyes: Conjunctivae are normal.  ENT      Head: Normocephalic and atraumatic.      Nose: No congestion/rhinnorhea.      Mouth/Throat: Mucous membranes are moist.      Neck: No stridor. Hematological/Lymphatic/Immunilogical: No cervical lymphadenopathy. Cardiovascular: Normal rate, regular rhythm.  No murmurs, rubs, or gallops.  Respiratory: Normal respiratory effort without tachypnea nor retractions. Breath sounds are clear and equal bilaterally. No wheezes/rales/rhonchi. Gastrointestinal: Soft and non tender. No rebound. No guarding.  Genitourinary: Deferred Musculoskeletal: Normal range of motion in all extremities. No lower extremity edema. Neurologic:  Normal speech and language. No gross focal neurologic deficits are appreciated.  Skin:  Erythema and fluctuance to lateral right ankle. Some fluctuance and swelling to left lateral proximal forearm with some drainage.  Psychiatric: Mood and affect are normal. Speech and behavior are normal. Patient exhibits appropriate insight and judgment.  ____________________________________________    LABS (pertinent positives/negatives)  CMP wnl except alb 3.3 CBC wbc 7.6, hgb 13.4  ____________________________________________   EKG  None  ____________________________________________    RADIOLOGY  Right ankle No acute osseous  injury  ____________________________________________   PROCEDURES  Procedures  Incision and Drainage of Abscess Location: right ankle Anesthesia Local: 1% Lidocaine with Epi  Prep/Procedure: Skin Prep: Chlorahex Incised abscess with #11 blade Purulent discharge: large Probed to break up loculations Packed with 1/4" gauze Estimated blood loss: 1 ml  ____________________________________________   INITIAL IMPRESSION / ASSESSMENT AND PLAN / ED COURSE  Pertinent labs & imaging results that were available during my care of the patient were reviewed by me and considered in my medical decision making (see chart for details).   Patient presented to the emergency department today because of concern for abscesses as well as right ankle pain. On exam patient has area of fluctuance and erythema to right lateral ankle. The joint itself is without any effusion or painful movement. Also with abscess to the proximal left forearm with some spontaneous drainage. The ankle abscess was incised and drained. Also recommended I and D of left forearm abscess however patient declined. At this time patient does not appear to be septic. Will discharge home with antibiotics.    ____________________________________________   FINAL  CLINICAL IMPRESSION(S) / ED DIAGNOSES  Final diagnoses:  Abscess     Note: This dictation was prepared with Dragon dictation. Any transcriptional errors that result from this process are unintentional     Phineas Semen, MD 03/23/20 [phone]

## 2020-03-23 NOTE — Discharge Instructions (Signed)
Please seek medical attention for any high fevers, chest pain, shortness of breath, change in behavior, persistent vomiting, bloody stool or any other new or concerning symptoms.  

## 2020-09-12 ENCOUNTER — Encounter: Payer: Self-pay | Admitting: Emergency Medicine

## 2020-09-12 ENCOUNTER — Other Ambulatory Visit: Payer: Self-pay

## 2020-09-12 DIAGNOSIS — Z87891 Personal history of nicotine dependence: Secondary | ICD-10-CM | POA: Insufficient documentation

## 2020-09-12 DIAGNOSIS — T402X1A Poisoning by other opioids, accidental (unintentional), initial encounter: Secondary | ICD-10-CM | POA: Diagnosis present

## 2020-09-12 DIAGNOSIS — M5412 Radiculopathy, cervical region: Secondary | ICD-10-CM | POA: Insufficient documentation

## 2020-09-12 DIAGNOSIS — Y900 Blood alcohol level of less than 20 mg/100 ml: Secondary | ICD-10-CM | POA: Insufficient documentation

## 2020-09-12 DIAGNOSIS — J45909 Unspecified asthma, uncomplicated: Secondary | ICD-10-CM | POA: Insufficient documentation

## 2020-09-12 DIAGNOSIS — M5442 Lumbago with sciatica, left side: Secondary | ICD-10-CM | POA: Diagnosis not present

## 2020-09-12 LAB — CBC
HCT: 41.1 % (ref 36.0–46.0)
Hemoglobin: 13.4 g/dL (ref 12.0–15.0)
MCH: 28.2 pg (ref 26.0–34.0)
MCHC: 32.6 g/dL (ref 30.0–36.0)
MCV: 86.3 fL (ref 80.0–100.0)
Platelets: 383 10*3/uL (ref 150–400)
RBC: 4.76 MIL/uL (ref 3.87–5.11)
RDW: 15.2 % (ref 11.5–15.5)
WBC: 9.7 10*3/uL (ref 4.0–10.5)
nRBC: 0 % (ref 0.0–0.2)

## 2020-09-12 LAB — SALICYLATE LEVEL: Salicylate Lvl: <7 mg/dL — ABNORMAL LOW (ref 7.0–30.0)

## 2020-09-12 LAB — COMPREHENSIVE METABOLIC PANEL
ALT: 221 U/L — ABNORMAL HIGH (ref 0–44)
AST: 391 U/L — ABNORMAL HIGH (ref 15–41)
Albumin: 3.9 g/dL (ref 3.5–5.0)
Alkaline Phosphatase: 68 U/L (ref 38–126)
Anion gap: 10 (ref 5–15)
BUN: 15 mg/dL (ref 6–20)
CO2: 25 mmol/L (ref 22–32)
Calcium: 9.1 mg/dL (ref 8.9–10.3)
Chloride: 104 mmol/L (ref 98–111)
Creatinine, Ser: 0.85 mg/dL (ref 0.44–1.00)
GFR, Estimated: >60 mL/min (ref >60)
Glucose, Bld: 123 mg/dL — ABNORMAL HIGH (ref 70–99)
Potassium: 3.1 mmol/L — ABNORMAL LOW (ref 3.5–5.1)
Sodium: 139 mmol/L (ref 135–145)
Total Bilirubin: 0.9 mg/dL (ref 0.3–1.2)
Total Protein: 8.1 g/dL (ref 6.5–8.1)

## 2020-09-12 LAB — ACETAMINOPHEN LEVEL: Acetaminophen (Tylenol), Serum: <10 ug/mL — ABNORMAL LOW (ref 10–30)

## 2020-09-12 LAB — ETHANOL: Alcohol, Ethyl (B): <10 mg/dL (ref <10)

## 2020-09-12 NOTE — ED Triage Notes (Signed)
Pt constantly moving around on the WC. Pt states that she took 2 pain pills, gabapentin because her back hurts from where she had surgery. Pt denies any other drug use, reports back pain.
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
## 2020-09-13 ENCOUNTER — Emergency Department: Payer: Medicaid Other

## 2020-09-13 ENCOUNTER — Emergency Department
Admission: EM | Admit: 2020-09-13 | Discharge: 2020-09-13 | Disposition: A | Discharge status: Home / Self Care | Payer: Medicaid Other | Attending: Emergency Medicine | Admitting: Emergency Medicine

## 2020-09-13 DIAGNOSIS — G8929 Other chronic pain: Secondary | ICD-10-CM

## 2020-09-13 DIAGNOSIS — T50901A Poisoning by unspecified drugs, medicaments and biological substances, accidental (unintentional), initial encounter: Secondary | ICD-10-CM

## 2020-09-13 DIAGNOSIS — M5412 Radiculopathy, cervical region: Secondary | ICD-10-CM

## 2020-09-13 MED ORDER — PREDNISONE 10 MG PO TABS: ORAL_TABLET | ORAL | 0 refills | Status: DC

## 2020-09-13 MED ORDER — DIPHENHYDRAMINE HCL 50 MG/ML IJ SOLN
12.5000 mg | INTRAMUSCULAR | Status: AC
  Administered 2020-09-13: 12.5 mg via INTRAVENOUS
  Filled 2020-09-13: qty 1

## 2020-09-13 MED ORDER — LIDOCAINE 5 % EX PTCH: 1.0000 | MEDICATED_PATCH | Freq: Two times a day (BID) | CUTANEOUS | 0 refills | Status: AC

## 2020-09-13 MED ORDER — DROPERIDOL 2.5 MG/ML IJ SOLN
2.5000 mg | Freq: Once | INTRAMUSCULAR | Status: AC
  Administered 2020-09-13: 2.5 mg via INTRAVENOUS
  Filled 2020-09-13: qty 2

## 2020-09-13 MED ORDER — GADOBUTROL 1 MMOL/ML IV SOLN
10.0000 mL | Freq: Once | INTRAVENOUS | Status: AC | PRN
  Administered 2020-09-13: 10 mL via INTRAVENOUS

## 2020-09-13 MED ORDER — POTASSIUM CHLORIDE CRYS ER 20 MEQ PO TBCR
40.0000 meq | EXTENDED_RELEASE_TABLET | Freq: Once | ORAL | Status: AC
  Administered 2020-09-13: 40 meq via ORAL
  Filled 2020-09-13: qty 2

## 2020-09-13 MED ORDER — PREDNISONE 20 MG PO TABS
60.0000 mg | ORAL_TABLET | ORAL | Status: AC
  Administered 2020-09-13: 60 mg via ORAL
  Filled 2020-09-13: qty 3

## 2020-09-13 NOTE — ED Provider Notes (Signed)
[facility] Emergency Department Provider Note  ____________________________________________   Event Date/Time   First MD Initiated Contact with Patient 09/13/20 0129     (approximate)  I have reviewed the triage vital signs and the nursing notes.   HISTORY  Chief Complaint Back Pain and Drug Overdose    HPI Tammy Hickman is a 30 y.o. female with medical and psychiatric history as listed below which includes opioid dependence and polysubstance abuse as well as prior back problems status post lumbar spine fusion in about 2004.  She presents tonight after an apparent accidental overdose.  She admits that she overdosed by accident on gabapentin but also states that she took something else that might of been Percocet or might have been a benzodiazepine and was also using cocaine.  She said that she has had a rough couple of days because of problems at work, but also that her back has been very painful.  She said that she was better after her surgery initially but gradually over time her back started giving her problems again.  For the last month she has had severe pain in her low back that radiates down her left leg "like it used to".  Additionally she reports numbness and weakness in her leg, and states that sometimes it is hard for her to urinate.  She is also reporting problems with both of her hands with intermittent numbness and tingling and occasional weakness in both hands where it feels hard to grip anything.  She is not having neck pain.  She denies any recent fevers or weight loss.  She has had no chest pain, shortness of breath, cough, nor abdominal pain.  No recent nausea nor vomiting.  She admits to prior IV drug use but claims it has been about a year.  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  When asked based on the [place]  controlled substance database, she agrees that she was on Suboxone as of a month ago but is unclear if she is still taking it. she reports that her pain currently is 10 out of 10.     Past Medical History:  Diagnosis Date   Anxiety    Asthma    Bipolar disorder (HCC)    Hernia, umbilical 2011   Mental disorders of mother, antepartum    Obesity complicating pregnancy, childbirth, or the puerperium, antepartum condition or complication(649.13)    Other specified indication for care or intervention related to labor and delivery, antepartum    Thyroid dysfunction     Patient Active Problem List   Diagnosis Date Noted   Pyelonephritis 12/08/2015   Cocaine abuse (HCC)    Elevated troponin I level    Leg swelling    NSTEMI (non-ST elevated myocardial infarction) (HCC) 11/03/2015   Low back pain 01/13/2015   Leg pain, right 01/13/2015   Opiate overdose (HCC) 10/31/2014   Dysthymia 10/31/2014   ADHD (attention deficit hyperactivity disorder) 10/31/2014   Obesity 07/27/2011   Contraception, generic surveillance 07/27/2011    Past Surgical History:  Procedure Laterality Date   ABDOMINAL SURGERY     BACK SURGERY     HERNIA REPAIR     umbilical hernia   TONSILLECTOMY  2009    Prior to Admission medications   Medication Sig Start Date End Date Taking? Authorizing Provider  lidocaine (LIDODERM) 5 %  Place 1 patch onto the skin every 12 (twelve) hours. Remove & Discard patch within 12 hours or as directed by MD 09/13/20 09/13/21 Yes Loleta Rose, MD  predniSONE (DELTASONE) 10 MG tablet Take 4 tabs (40 mg) PO x 3 days, then take 2 tabs (20 mg) PO x 3 days, then take 1 tab (10 mg) PO x 3 days, then take 1/2 tab (5 mg) PO x 4 days. 09/13/20  Yes Loleta Rose, MD  acetaminophen (TYLENOL) 325 MG tablet Take 2 tablets (650 mg total) by mouth every 6 (six) hours as needed for moderate pain. 10/09/15   Hagler, Jami L, PA-C  albuterol (PROVENTIL HFA;VENTOLIN HFA) 108 (90 Base) MCG/ACT inhaler  Inhale 2 puffs into the lungs every 6 (six) hours as needed for wheezing or shortness of breath. 12/26/17   Tommi Rumps, PA-C  potassium chloride 20 MEQ TBCR Take 10 mEq by mouth daily. 12/30/19   Tommi Rumps, PA-C    Allergies Penicillins, Prednisone, Fentanyl, Meloxicam, and Hydromorphone  Family History  Problem Relation Age of Onset   Cancer Paternal Grandfather        Esophageal   Heart disease Paternal Grandmother    Diabetes Maternal Grandmother    Heart disease Maternal Grandfather    Diabetes Maternal Grandfather    Cancer Maternal Grandfather        Esophageal   Heart disease Maternal Aunt        Fatal MI in late-30's    Social History Social History   Tobacco Use   Smoking status: Former    Packs/day: 0.20    Types: Cigarettes    Quit date: 11/14/2009    Years since quitting: 10.8   Smokeless tobacco: Never  Substance Use Topics   Alcohol use: No    Alcohol/week: 0.0 standard drinks   Drug use: Yes    Types: Cocaine, Marijuana    Comment: Pain pills    Review of Systems Constitutional: No fever/chills.  No recent weight loss. Eyes: No visual changes. ENT: No sore throat. Cardiovascular: Denies chest pain. Respiratory: Denies shortness of breath. Gastrointestinal: No abdominal pain.  No nausea, no vomiting.  No diarrhea.  No constipation. Genitourinary: Negative for dysuria.  Patient reports occasional inability to urinate. Musculoskeletal: Severe lower back pain.  Also reporting numbness and weakness in hands. Integumentary: Negative for rash. Neurological: Negative for headaches, focal weakness or numbness.   ____________________________________________   PHYSICAL EXAM:  VITAL SIGNS: ED Triage Vitals  Enc Vitals Group     BP 09/12/20 1828 (!) 133/109     Pulse Rate 09/12/20 1828 (!) 107     Resp 09/12/20 1828 18     Temp 09/12/20 2139 98.5 F (36.9 C)     Temp Source 09/12/20 2139 Oral     SpO2 09/12/20 1828 98 %     Weight  09/12/20 1643 118 kg (260 lb 2.3 oz)     Height 09/12/20 1643 1.727 m (5\' 8" )     Head Circumference --      Peak Flow --      Pain Score 09/12/20 1643 10     Pain Loc --      Pain Edu? --      Excl. in [place]? --     Constitutional: Alert and oriented.  Eyes: Conjunctivae are normal.  Head: Atraumatic. Nose: No congestion/rhinnorhea. Mouth/Throat: Patient is wearing a mask. Neck: No stridor.  No meningeal signs.   Cardiovascular: Normal rate, regular rhythm. Good peripheral circulation.  Respiratory: Normal respiratory effort.  No retractions. Gastrointestinal: Soft and nontender. No distention.  Musculoskeletal: No lower extremity tenderness nor edema. No gross deformities of extremities. Neurologic:  Normal speech and language.  Patient is constantly moving around visit from discomfort from her lower back.  On my exam she seems to have good and equal strength in bilateral arms and legs although there is some degree of limitation perhaps by effort or limited by pain.  She is reporting some suggestive tingling in her hands. Skin:  Skin is warm, dry and intact. Extensive subacute bruising on arms. Multiple pock marks on her skin consistent with either injection sites, areas of excoriation from scratching and picking behavior, etc. no evidence of active cellulitis or abscesses are visible.  These areas are visible on her arms as well as several on her face.  There is a well-healed surgical scar on her lumbar spine consistent with her history of lumbar spine fusion. Psychiatric: Mood and affect are normal. Speech and behavior are normal.  ____________________________________________   LABS (all labs ordered are listed, but only abnormal results are displayed)  Labs Reviewed  COMPREHENSIVE METABOLIC PANEL - Abnormal; Notable for the following components:      Result Value   Potassium 3.1 (*)    Glucose, Bld 123 (*)    AST 391 (*)    ALT 221 (*)    All other components within normal limits   SALICYLATE LEVEL - Abnormal; Notable for the following components:   Salicylate Lvl <7.0 (*)    All other components within normal limits  ACETAMINOPHEN LEVEL - Abnormal; Notable for the following components:   Acetaminophen (Tylenol), Serum <10 (*)    All other components within normal limits  ETHANOL  CBC  URINE DRUG SCREEN, QUALITATIVE ([facility] ONLY)   ____________________________________________  EKG  No indication for acute EKG, but review of old EKG demonstrates a normal QTc interval. ____________________________________________  RADIOLOGY  MRIs of C, T, and L spines pending at time of transfer of care. ____________________________________________   PROCEDURES   Procedure(s) performed (including Critical Care):  .1-3 Lead EKG Interpretation  Date/Time: 09/13/2020 4:37 AM Performed by: Loleta RoseForbach, Siraj Dermody, MD Authorized by: Loleta RoseForbach, Jonae Renshaw, MD     Interpretation: abnormal     ECG rate:  100   ECG rate assessment: tachycardic     Rhythm: sinus tachycardia     Ectopy: none     Conduction: normal     ____________________________________________   INITIAL IMPRESSION / MDM / ASSESSMENT AND PLAN / ED COURSE  As part of my medical decision making, I reviewed the following data within the electronic MEDICAL RECORD NUMBER Nursing notes reviewed and incorporated, Labs reviewed , Old EKG reviewed, Old chart reviewed, Patient signed out to Dr. Erma HeritageIsaacs, Notes from prior ED visits, and [place] Controlled Substance Database   Differential diagnosis includes, but is not limited to, chronic back pain, secondary gain, cauda equina syndrome, epidural abscess, osteomyelitis, discitis, transverse myelitis.  The patient is on the cardiac monitor to evaluate for evidence of arrhythmia and/or significant heart rate changes.  I strongly suspect that the patient is having chronic pain with the contributing factor of substance abuse and possible secondary gain.  However, given a history of IV drug use,  borderline tachycardia which could be due to pain, withdrawal, or infection, and reports of subjective numbness, tingling, and weakness in both upper and lower extremities, I feel that it is incumbent upon me to prove that she does not have an infection such  as an epidural abscess that could be noncontiguous and affect both the cervical spine and the lumbar spine.  Her lab work is reassuring with no leukocytosis and she is afebrile but both of these could be normal in the case of a spinal infection that is slow and insidious and has been worsening over the last month.  Renal function is normal and comprehensive metabolic panel is unremarkable other than some mild and nonspecific LFT elevation but she has no abdominal pain or tenderness.  I am providing droperidol 2.5 mg IV to help as a calming agent and to help with her chronic pain while she is getting MRIs of her cervical, thoracic, and lumbar spine to thoroughly assess the possibility of neurological/spinal impingement versus infection.  The patient understands and agrees with the current plan.     Clinical Course as of 09/13/20 4098  Sat Sep 13, 2020  1191 Patient did not tolerate the MRIs, she would move the soon as they were getting set up to begin the imaging.  I am trying to balance the need for the imaging without over sedating her.  I will try again with another 2.5 mg of IV droperidol with the addition of 12 and half milligrams of diphenhydramine.  I think this will be a safe method that should not result in respiratory difficulties while she is in the extensive imaging.  Alternatives such as benzodiazepines might be more likely to suppress her respirations. [CF]  [phone] Reportedly the patient tolerated the MRIs okay after the second dose of droperidol and Benadryl.  Awaiting imaging results.  I have discussed the case in person with Dr. Erma Heritage and he is assuming ED care to follow-up on the imaging and disposition appropriately. [CF]    Clinical  Course User Index [CF] Loleta Rose, MD     ____________________________________________  FINAL CLINICAL IMPRESSION(S) / ED DIAGNOSES  Final diagnoses:  Accidental overdose, initial encounter  Chronic low back pain with left-sided sciatica, unspecified back pain laterality  Cervical radiculopathy     MEDICATIONS GIVEN DURING THIS VISIT:  Medications  potassium chloride SA (KLOR-CON) CR tablet 40 mEq (40 mEq Oral Given 09/13/20 0413)  predniSONE (DELTASONE) tablet 60 mg (60 mg Oral Given 09/13/20 0413)  droperidol (INAPSINE) 2.5 MG/ML injection 2.5 mg (2.5 mg Intravenous Given 09/13/20 0414)  gadobutrol (GADAVIST) 1 MMOL/ML injection 10 mL (10 mLs Intravenous Contrast Given 09/13/20 0630)  droperidol (INAPSINE) 2.5 MG/ML injection 2.5 mg (2.5 mg Intravenous Given 09/13/20 0538)  diphenhydrAMINE (BENADRYL) injection 12.5 mg (12.5 mg Intravenous Given 09/13/20 0539)     ED Discharge Orders          Ordered    lidocaine (LIDODERM) 5 %  Every 12 hours        09/13/20 0322    predniSONE (DELTASONE) 10 MG tablet        09/13/20 9562             Note:  This document was prepared using Dragon voice recognition software and may include unintentional dictation errors.   Loleta Rose, MD 09/13/20 [phone]

## 2020-09-13 NOTE — ED Provider Notes (Signed)
Reviewed MRI C, T, L-spine.  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  This abuts the cord but there is no cord edema or evidence to suggest acute cord compression.  She has possible mild neck sprain in the cervical spine, but no cord compression, herniation, or evidence that would correlate with the reported hand numbness.  I discussed the findings with Dr. Adriana Simas of neurosurgery who reviewed the imaging.  Patient remains afebrile, hemodynamically stable, with no leukocytosis or signs of sepsis or other evidence to suggest infectious myositis or osteomyelitis.  There is no enhancement on the MRI.  Will refer for outpatient follow-up, but no apparent emergent pathology at this time.  Patient counseled on the importance of stopping or minimizing her IV drug use, and the risk it poses in terms of worsening of her back pain, infection, sepsis, death.  Otherwise, she feels markedly improved.  Her blood pressure was recorded is 99/54 but this was while she was asleep with her arm up, blood pressures otherwise been stable.  Discharged with outpatient follow-up.   Shaune Pollack, MD 09/13/20 [phone]

## 2020-09-13 NOTE — ED Notes (Signed)
Pt back from MRI 

## 2020-09-13 NOTE — Discharge Instructions (Addendum)
As we discussed, your evaluation was generally reassuring tonight in spite of your ongoing sciatica.  We recommend you try the medication prescribed today as well as your chronic medications including your Suboxone.  Please call on Monday morning to the office of Dr. Adriana Simas to schedule a follow-up appointment with neurosurgery.  This will be very valuable for you so that he can get an expert opinion and additional evaluation and treatment.  Please avoid taking more medication than prescribed and do not take any medications or drugs that are not prescribed to you personally.  Return to the emergency department if you develop new or worsening symptoms that concern you.

## 2020-09-13 NOTE — ED Notes (Signed)
Pt to MRI

## 2021-01-30 ENCOUNTER — Ambulatory Visit: Payer: Medicaid Other

## 2021-01-30 ENCOUNTER — Ambulatory Visit: Payer: Self-pay

## 2021-02-25 ENCOUNTER — Ambulatory Visit: Payer: Medicaid Other

## 2021-03-04 ENCOUNTER — Other Ambulatory Visit: Payer: Self-pay | Admitting: Family Medicine

## 2021-03-04 DIAGNOSIS — A539 Syphilis, unspecified: Secondary | ICD-10-CM

## 2021-03-04 MED ORDER — DOXYCYCLINE HYCLATE 100 MG PO TABS: 100.0000 mg | ORAL_TABLET | Freq: Two times a day (BID) | ORAL | 0 refills | Status: AC

## 2021-03-04 NOTE — Progress Notes (Signed)
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  Titer was  1:64.  D/T patient situation of incarceration and PCN allergy, Pt will be treated for + syphilis with Doxycycline 100 mg PO BID x 28 days.  To be administered by medical staff at [facility].   RX sent to [facility] medical department.  Delivered by Consuela Mimes, RN    Wendi Snipes, FNP

## 2021-03-05 ENCOUNTER — Ambulatory Visit: Payer: Self-pay | Admitting: Family Medicine

## 2021-03-05 DIAGNOSIS — A539 Syphilis, unspecified: Secondary | ICD-10-CM

## 2021-03-05 NOTE — Progress Notes (Signed)
[facility] provided prescription for syphilis tx M.Garner, NP .Hard copy -brought to [facility] given to medical staff -handed to [facility] on 03/04/21. For Doxycycline 100mg   POBID x28days  Corresponded with S.Fortner-LPN from [facility] regarding pt.   1 - LMP = mid-December (WNL) 2 - Last sex = 02/09/21 (day prior to arrest) 3 - UPT = negative (just now) 4 - yes, to all of those allergies 5 - on no meds   Had to return to jail today 1/12 for another pt. Confirmed with S. Fortner received script for pt.

## 2021-03-10 NOTE — Progress Notes (Signed)
This patient was not seen by this provider.   Pt is inmate at [facility], with + RPR results.   Discussed with RN plan for treatment for this patient.   Written RX sent by RN for delivery to the [facility] medical team for treatment.    Wendi Snipes, FNP

## 2021-05-04 ENCOUNTER — Emergency Department
Admission: EM | Admit: 2021-05-04 | Discharge: 2021-05-04 | Disposition: A | Discharge status: Home / Self Care | Payer: Medicaid Other | Attending: Emergency Medicine | Admitting: Emergency Medicine

## 2021-05-04 ENCOUNTER — Emergency Department: Payer: Medicaid Other

## 2021-05-04 ENCOUNTER — Encounter: Payer: Self-pay | Admitting: Emergency Medicine

## 2021-05-04 ENCOUNTER — Other Ambulatory Visit: Payer: Self-pay

## 2021-05-04 DIAGNOSIS — D72829 Elevated white blood cell count, unspecified: Secondary | ICD-10-CM | POA: Insufficient documentation

## 2021-05-04 DIAGNOSIS — N939 Abnormal uterine and vaginal bleeding, unspecified: Secondary | ICD-10-CM

## 2021-05-04 DIAGNOSIS — R102 Pelvic and perineal pain: Secondary | ICD-10-CM | POA: Insufficient documentation

## 2021-05-04 LAB — BASIC METABOLIC PANEL
Anion gap: 7 (ref 5–15)
BUN: 26 mg/dL — ABNORMAL HIGH (ref 6–20)
CO2: 23 mmol/L (ref 22–32)
Calcium: 8.3 mg/dL — ABNORMAL LOW (ref 8.9–10.3)
Chloride: 105 mmol/L (ref 98–111)
Creatinine, Ser: 0.8 mg/dL (ref 0.44–1.00)
GFR, Estimated: >60 mL/min (ref >60)
Glucose, Bld: 132 mg/dL — ABNORMAL HIGH (ref 70–99)
Potassium: 3.4 mmol/L — ABNORMAL LOW (ref 3.5–5.1)
Sodium: 135 mmol/L (ref 135–145)

## 2021-05-04 LAB — CBC WITH DIFFERENTIAL/PLATELET
Abs Immature Granulocytes: 0.05 K/uL (ref 0.00–0.07)
Basophils Absolute: 0 K/uL (ref 0.0–0.1)
Basophils Relative: 0 %
Eosinophils Absolute: 0.3 K/uL (ref 0.0–0.5)
Eosinophils Relative: 2 %
HCT: 33.7 % — ABNORMAL LOW (ref 36.0–46.0)
Hemoglobin: 10.8 g/dL — ABNORMAL LOW (ref 12.0–15.0)
Immature Granulocytes: 0 %
Lymphocytes Relative: 21 %
Lymphs Abs: 2.9 K/uL (ref 0.7–4.0)
MCH: 29.4 pg (ref 26.0–34.0)
MCHC: 32 g/dL (ref 30.0–36.0)
MCV: 91.8 fL (ref 80.0–100.0)
Monocytes Absolute: 1 K/uL (ref 0.1–1.0)
Monocytes Relative: 8 %
Neutro Abs: 9.6 K/uL — ABNORMAL HIGH (ref 1.7–7.7)
Neutrophils Relative %: 69 %
Platelets: 318 K/uL (ref 150–400)
RBC: 3.67 MIL/uL — ABNORMAL LOW (ref 3.87–5.11)
RDW: 13.8 % (ref 11.5–15.5)
WBC: 13.9 K/uL — ABNORMAL HIGH (ref 4.0–10.5)
nRBC: 0 % (ref 0.0–0.2)

## 2021-05-04 LAB — TYPE AND SCREEN
ABO/RH(D): O POS
Antibody Screen: NEGATIVE

## 2021-05-04 LAB — HCG, QUANTITATIVE, PREGNANCY: hCG, Beta Chain, Quant, S: <1 m[IU]/mL (ref <5)

## 2021-05-04 MED ORDER — MORPHINE SULFATE (PF) 4 MG/ML IV SOLN
4.0000 mg | Freq: Once | INTRAVENOUS | Status: AC
  Administered 2021-05-04: 4 mg via INTRAVENOUS
  Filled 2021-05-04: qty 1

## 2021-05-04 MED ORDER — LORAZEPAM 2 MG/ML IJ SOLN
2.0000 mg | Freq: Once | INTRAMUSCULAR | Status: AC
  Administered 2021-05-04: 2 mg via INTRAVENOUS
  Filled 2021-05-04: qty 1

## 2021-05-04 NOTE — ED Provider Notes (Signed)
Vitals:  ? 05/04/21 1530 05/04/21 1615  ?BP: (!) 102/54 (!) 101/54  ?Pulse: 92 86  ?Resp: (!) 21 14  ?Temp:    ?SpO2: 99% 99%  ? ? ? ?The patient is resting at this time.  No distress.  Her boyfriend is also at the bedside.  ? ?He is comfortable with the plan to be discharged.  Given she was given Ativan several hours earlier, we will trial ambulation. ?  Sharyn Creamer, MD ?05/04/21 1837 ? ?

## 2021-05-04 NOTE — ED Notes (Signed)
Pt discharged home with bf per MD order. Discharge summary reviewed, pt verbalizes understanding. No s/s of acute distress noted at discharge.  ?

## 2021-05-04 NOTE — ED Provider Notes (Signed)
Vitals:  ? 05/04/21 1800 05/04/21 1845  ?BP: 109/83   ?Pulse: 85   ?Resp: 13   ?Temp:  98.3 ?F (36.8 ?C)  ?SpO2: 100%   ? ? ?Patient alert, boyfriend at bedside.  Feeling improved.  Able to trial ambulation with nurse.  She reports she feels just slightly lightheaded, but vital signs normal fully awake and alert.  No distress.  Appears appropriate for ongoing outpatient work-up at this time. ? ?  ?Delman Kitten, MD ?05/04/21 1847 ? ?

## 2021-05-04 NOTE — ED Notes (Signed)
Pt bf to bedside, reports pt is acting herself. Pt provided "ED HAPPY MEAL" ?

## 2021-05-04 NOTE — ED Notes (Signed)
Req form printed and pathology specimen walked to pathology lab. ?

## 2021-05-04 NOTE — ED Provider Notes (Signed)
? ?[facility] ?Provider Note ? ? ? Event Date/Time  ? First MD Initiated Contact with Patient 05/04/21 1023   ?  (approximate) ? ?History  ? ?Chief Complaint: Abdominal Pain and Vaginal Bleeding ? ?HPI ? ?Tammy Hickman is a 31 y.o. female with a past medical history of anxiety, bipolar, presents to the emergency department for possible miscarriage.  Patient arrives to the emergency department in distress.  Patient is flailing around the bed.  Patient is not able to answer most questions due to her flailing.  Appears to be intoxicated although patient denies substance use besides marijuana.  Patient is jerking around the bed making IV placement Difficult.  Patient is complaining of vaginal bleeding that started this morning.  Patient noted to have blood soaked pants/underwear.  Patient states she believes her last period was December 1 but she is not entirely sure. ? ?Physical Exam  ? ?Triage Vital Signs: ?ED Triage Vitals  ?Enc Vitals Group  ?   BP 05/04/21 0944 (!) 143/121  ?   Pulse Rate 05/04/21 0944 (!) 144  ?   Resp 05/04/21 0944 20  ?   Temp 05/04/21 0944 98.3 ?F (36.8 ?C)  ?   Temp Source 05/04/21 0944 Oral  ?   SpO2 05/04/21 0944 96 %  ?   Weight 05/04/21 0945 220 lb (99.8 kg)  ?   Height 05/04/21 0945 5\' 8"  (1.727 m)  ?   Head Circumference --   ?   Peak Flow --   ?   Pain Score 05/04/21 0945 10  ?   Pain Loc --   ?   Pain Edu? --   ?   Excl. in [place]? --   ? ? ?Most recent vital signs: ?Vitals:  ? 05/04/21 0944  ?BP: (!) 143/121  ?Pulse: (!) 144  ?Resp: 20  ?Temp: 98.3 ?F (36.8 ?C)  ?SpO2: 96%  ? ? ?General: Awake, yelling randomly at times, jerking and flailing around on the bed unable to hold still. ?CV:  Good peripheral perfusion.  Regular rhythm rate around 120 bpm. ?Resp:  Normal effort.  Equal breath sounds bilaterally.  ?Abd:  Soft, no distention.  While distracted patient had no reaction to abdominal palpation. ?Other:  Noted to have bloodsoaked underwear. ? ? ?ED Results  / Procedures / Treatments  ? ?RADIOLOGY ? ?Ultrasound is negative. ? ? ?MEDICATIONS ORDERED IN ED: ?Medications  ?LORazepam (ATIVAN) injection 2 mg (has no administration in time range)  ? ? ? ?IMPRESSION / MDM / ASSESSMENT AND PLAN / ED COURSE  ?I reviewed the triage vital signs and the nursing notes. ? ?Patient presents to the emergency department for vaginal bleeding concern for possible miscarriage.  Patient states she did not know she was pregnant but her last period which she believes December 1.  Patient is complaining of lower abdominal pain.  Patient is in significant distress writhing around on the bed not necessarily due to pain, but unwilling to hold still or unable to.  Nurse was able to place an IV after several attempts.  We will check labs and obtain an ultrasound to evaluate for possible miscarriage.  Ideally we will perform a pelvic exam as well once the patient is able to calm down.  We will dose 2 mg of IV Ativan to help calm the patient so a more in-depth exam can be performed. ? ?Patient's IV infiltrated during attempted Ativan administration.  Patient remains very restless complaining of abdominal pain.  We will dose morphine once a new IV is placed.  I was able to perform a pelvic exam.  There is no active or at least no significant active vaginal bleeding.  Digital exam unable to palpate cervix. ? ?Patient became very somnolent after pain medication.  She awakens to voice but falls back asleep.  Reassuringly patient's labs are largely at baseline besides mild leukocytosis.  Chemistry is largely at baseline.  Type and screen has been completed and the patient's quantitative beta-hCG is negative.  We will proceed with a ultrasound of the pelvis. ? ?Patient's ultrasound is normal.  I reviewed the patient's recent visits including multiple ER visits.  Patient was last seen on a non-ER visit by surgery 01/01/2020 for breast abscess. ? ?Given the patient's reassuring work-up, negative pregnancy  test reassuring ultrasound believe the patient is safe for discharge home.  Patient is still somnolent after medication we will allow the patient to awaken and then discharge with OB/GYN follow-up. ? ?FINAL CLINICAL IMPRESSION(S) / ED DIAGNOSES  ? ?Pelvic pain ?Vaginal bleeding ? ?Note:  This document was prepared using Dragon voice recognition software and may include unintentional dictation errors. ?  ?Harvest Dark, MD ?05/04/21 1452 ? ?

## 2021-05-04 NOTE — ED Notes (Addendum)
This RN visualized in the waiting room by this RN, pt with a large pool of blood around pt's wheel chair and multiple large products of conception. Collected tissue in appropriate container. Pt assisted from wheelchair to stretcher and pt moved to room 19 at this time. Pt continuously flaring arms and screaming and moaning, attempting to console pt. Dr. Lenard Lance, MD at bedside at this time.  ? ?Notified Barbee Cough, primary RN.  ?

## 2021-05-04 NOTE — ED Notes (Signed)
EDP at bedside for speculum exam. ?

## 2021-05-04 NOTE — ED Notes (Signed)
IV is infiltrated after ativan given and flushed. Trying to get new line. ? ?

## 2021-05-04 NOTE — ED Triage Notes (Signed)
Pt to ED via [facility] with c/o abd pain and vaginal bleeding. She has not had a period in three months. She is a IV drug user. She has two high risk pregnancies that went full terms. She has not been confirmed pregnancy has not seen an OB GYN since missing her periods. Pt has scabs all over her body. She started bleeding this am at 0745 ?

## 2021-05-04 NOTE — ED Notes (Addendum)
Pt states has been bleeding since 0730 this morning. LNMP was 11/23, no period in 12/23. Placental tissue placed in pathology container to walk to pathology lab. ? ?Since arrival, pt has been thrashing in bed and complains of lower abdominal pain and pressure.  ? ?EBL . ? ?Pt has track marks and skin ulcers all over body. Denies IV drug use, states only smokes marijuana. Pt has erratic behavior and body movements. ?

## 2021-05-06 LAB — SURGICAL PATHOLOGY

## 2021-09-17 ENCOUNTER — Other Ambulatory Visit: Payer: Self-pay

## 2021-09-17 MED ORDER — PERMETHRIN 5 % EX CREA: 1.0000 | TOPICAL_CREAM | Freq: Once | CUTANEOUS | 1 refills | Status: AC

## 2023-01-06 ENCOUNTER — Encounter (HOSPITAL_COMMUNITY): Payer: Self-pay | Admitting: Emergency Medicine

## 2023-01-06 ENCOUNTER — Emergency Department (HOSPITAL_COMMUNITY): Payer: MEDICAID

## 2023-01-06 ENCOUNTER — Emergency Department (HOSPITAL_COMMUNITY)
Admission: EM | Admit: 2023-01-06 | Discharge: 2023-01-06 | Disposition: A | Discharge status: Home / Self Care | Payer: MEDICAID | Attending: Emergency Medicine | Admitting: Emergency Medicine

## 2023-01-06 ENCOUNTER — Other Ambulatory Visit: Payer: Self-pay

## 2023-01-06 DIAGNOSIS — M542 Cervicalgia: Secondary | ICD-10-CM | POA: Diagnosis not present

## 2023-01-06 DIAGNOSIS — S0990XA Unspecified injury of head, initial encounter: Secondary | ICD-10-CM | POA: Insufficient documentation

## 2023-01-06 DIAGNOSIS — Y92410 Unspecified street and highway as the place of occurrence of the external cause: Secondary | ICD-10-CM | POA: Diagnosis not present

## 2023-01-06 DIAGNOSIS — M25561 Pain in right knee: Secondary | ICD-10-CM | POA: Diagnosis not present

## 2023-01-06 LAB — CBC WITH DIFFERENTIAL/PLATELET
Abs Immature Granulocytes: 0.04 10*3/uL (ref 0.00–0.07)
Basophils Absolute: 0 10*3/uL (ref 0.0–0.1)
Basophils Relative: 0 %
Eosinophils Absolute: 0.2 10*3/uL (ref 0.0–0.5)
Eosinophils Relative: 2 %
HCT: 44.8 % (ref 36.0–46.0)
Hemoglobin: 14.3 g/dL (ref 12.0–15.0)
Immature Granulocytes: 0 %
Lymphocytes Relative: 16 %
Lymphs Abs: 1.6 10*3/uL (ref 0.7–4.0)
MCH: 28.6 pg (ref 26.0–34.0)
MCHC: 31.9 g/dL (ref 30.0–36.0)
MCV: 89.6 fL (ref 80.0–100.0)
Monocytes Absolute: 0.4 10*3/uL (ref 0.1–1.0)
Monocytes Relative: 4 %
Neutro Abs: 7.8 10*3/uL — ABNORMAL HIGH (ref 1.7–7.7)
Neutrophils Relative %: 78 %
Platelets: 217 10*3/uL (ref 150–400)
RBC: 5 MIL/uL (ref 3.87–5.11)
RDW: 12.9 % (ref 11.5–15.5)
WBC: 10.1 10*3/uL (ref 4.0–10.5)
nRBC: 0 % (ref 0.0–0.2)

## 2023-01-06 LAB — BASIC METABOLIC PANEL
Anion gap: 7 (ref 5–15)
BUN: 14 mg/dL (ref 6–20)
CO2: 24 mmol/L (ref 22–32)
Calcium: 9.4 mg/dL (ref 8.9–10.3)
Chloride: 103 mmol/L (ref 98–111)
Creatinine, Ser: 0.61 mg/dL (ref 0.44–1.00)
GFR, Estimated: >60 mL/min (ref >60)
Glucose, Bld: 102 mg/dL — ABNORMAL HIGH (ref 70–99)
Potassium: 4.9 mmol/L (ref 3.5–5.1)
Sodium: 134 mmol/L — ABNORMAL LOW (ref 135–145)

## 2023-01-06 LAB — HCG, SERUM, QUALITATIVE: Preg, Serum: NEGATIVE

## 2023-01-06 MED ORDER — ACETAMINOPHEN 325 MG PO TABS: 650.0000 mg | ORAL_TABLET | Freq: Once | ORAL | Status: DC

## 2023-01-06 MED ORDER — CYCLOBENZAPRINE HCL 10 MG PO TABS: 10.0000 mg | ORAL_TABLET | Freq: Two times a day (BID) | ORAL | 0 refills | Status: AC | PRN
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
